# Patient Record
Sex: Male | Born: 1935 | Race: White | Hispanic: No | Marital: Married | Smoking: Former smoker
Health system: Southern US, Community
[De-identification: ages and names within clinical notes are randomized; demographics above are authoritative.]

## PROBLEM LIST (undated history)

## (undated) DIAGNOSIS — C4490 Unspecified malignant neoplasm of skin, unspecified: Secondary | ICD-10-CM

## (undated) DIAGNOSIS — M1990 Unspecified osteoarthritis, unspecified site: Secondary | ICD-10-CM

## (undated) DIAGNOSIS — L570 Actinic keratosis: Secondary | ICD-10-CM

## (undated) DIAGNOSIS — H409 Unspecified glaucoma: Secondary | ICD-10-CM

## (undated) DIAGNOSIS — I471 Supraventricular tachycardia, unspecified: Secondary | ICD-10-CM

## (undated) DIAGNOSIS — E349 Endocrine disorder, unspecified: Secondary | ICD-10-CM

## (undated) DIAGNOSIS — N529 Male erectile dysfunction, unspecified: Secondary | ICD-10-CM

## (undated) DIAGNOSIS — R55 Syncope and collapse: Secondary | ICD-10-CM

## (undated) DIAGNOSIS — I219 Acute myocardial infarction, unspecified: Secondary | ICD-10-CM

## (undated) DIAGNOSIS — I10 Essential (primary) hypertension: Secondary | ICD-10-CM

## (undated) DIAGNOSIS — I7300 Raynaud's syndrome without gangrene: Secondary | ICD-10-CM

## (undated) DIAGNOSIS — I2510 Atherosclerotic heart disease of native coronary artery without angina pectoris: Secondary | ICD-10-CM

## (undated) DIAGNOSIS — D689 Coagulation defect, unspecified: Secondary | ICD-10-CM

## (undated) DIAGNOSIS — E785 Hyperlipidemia, unspecified: Secondary | ICD-10-CM

## (undated) HISTORY — DX: Raynaud's syndrome without gangrene: I73.00

## (undated) HISTORY — DX: Actinic keratosis: L57.0

## (undated) HISTORY — DX: Essential (primary) hypertension: I10

## (undated) HISTORY — DX: Unspecified osteoarthritis, unspecified site: M19.90

## (undated) HISTORY — DX: Supraventricular tachycardia, unspecified: I47.10

## (undated) HISTORY — DX: Syncope and collapse: R55

## (undated) HISTORY — DX: Unspecified malignant neoplasm of skin, unspecified: C44.90

## (undated) HISTORY — PX: CARDIAC CATHETERIZATION: SHX172

## (undated) HISTORY — PX: TRACHEAL SURGERY: SHX1096

## (undated) HISTORY — DX: Atherosclerotic heart disease of native coronary artery without angina pectoris: I25.10

## (undated) HISTORY — DX: Unspecified glaucoma: H40.9

## (undated) HISTORY — PX: TONSILLECTOMY: SUR1361

## (undated) HISTORY — DX: Coagulation defect, unspecified: D68.9

## (undated) HISTORY — DX: Acute myocardial infarction, unspecified: I21.9

## (undated) HISTORY — DX: Endocrine disorder, unspecified: E34.9

## (undated) HISTORY — PX: CERVICAL SPINE SURGERY: SHX589

## (undated) HISTORY — DX: Male erectile dysfunction, unspecified: N52.9

## (undated) HISTORY — DX: Hyperlipidemia, unspecified: E78.5

## (undated) HISTORY — PX: OTHER SURGICAL HISTORY: SHX169

## (undated) HISTORY — DX: Supraventricular tachycardia: I47.1

---

## 2005-11-09 ENCOUNTER — Ambulatory Visit: Payer: Self-pay | Admitting: Unknown Physician Specialty

## 2011-05-11 ENCOUNTER — Ambulatory Visit: Payer: Self-pay | Admitting: Unknown Physician Specialty

## 2011-05-12 LAB — PATHOLOGY REPORT

## 2012-02-25 ENCOUNTER — Emergency Department: Payer: Self-pay | Admitting: Emergency Medicine

## 2012-02-25 LAB — BASIC METABOLIC PANEL
Anion Gap: 8 (ref 7–16)
Chloride: 104 mmol/L (ref 98–107)
Creatinine: 1.01 mg/dL (ref 0.60–1.30)
EGFR (African American): >60
Glucose: 92 mg/dL (ref 65–99)
Osmolality: 284 (ref 275–301)
Potassium: 4.5 mmol/L (ref 3.5–5.1)
Sodium: 142 mmol/L (ref 136–145)

## 2012-02-25 LAB — PROTIME-INR: Prothrombin Time: 11.8 secs (ref 11.5–14.7)

## 2012-02-25 LAB — CBC
MCH: 30 pg (ref 26.0–34.0)
MCHC: 32.3 g/dL (ref 32.0–36.0)
MCV: 93 fL (ref 80–100)
Platelet: 235 10*3/uL (ref 150–440)

## 2012-02-25 LAB — CK TOTAL AND CKMB (NOT AT ARMC)
CK, Total: 614 U/L — ABNORMAL HIGH (ref 35–232)
CK-MB: 17.5 ng/mL — ABNORMAL HIGH (ref 0.5–3.6)

## 2012-02-29 HISTORY — PX: CORONARY ANGIOPLASTY WITH STENT PLACEMENT: SHX49

## 2013-01-01 ENCOUNTER — Ambulatory Visit: Payer: Self-pay | Admitting: Family Medicine

## 2013-02-11 ENCOUNTER — Ambulatory Visit: Payer: Self-pay | Admitting: Unknown Physician Specialty

## 2013-02-20 ENCOUNTER — Ambulatory Visit: Payer: Self-pay | Admitting: Unknown Physician Specialty

## 2013-03-08 ENCOUNTER — Ambulatory Visit: Payer: Self-pay | Admitting: Neurology

## 2013-03-08 LAB — CREATININE, SERUM: EGFR (African American): >60

## 2015-09-07 ENCOUNTER — Ambulatory Visit (INDEPENDENT_AMBULATORY_CARE_PROVIDER_SITE_OTHER): Payer: Medicare Other | Admitting: Cardiovascular Disease

## 2015-09-07 ENCOUNTER — Encounter: Payer: Self-pay | Admitting: Cardiovascular Disease

## 2015-09-07 VITALS — BP 140/80 | HR 89 | Ht 72.0 in | Wt 174.0 lb

## 2015-09-07 DIAGNOSIS — Z955 Presence of coronary angioplasty implant and graft: Secondary | ICD-10-CM | POA: Diagnosis not present

## 2015-09-07 DIAGNOSIS — I25111 Atherosclerotic heart disease of native coronary artery with angina pectoris with documented spasm: Secondary | ICD-10-CM | POA: Insufficient documentation

## 2015-09-07 DIAGNOSIS — R61 Generalized hyperhidrosis: Secondary | ICD-10-CM

## 2015-09-07 DIAGNOSIS — E785 Hyperlipidemia, unspecified: Secondary | ICD-10-CM

## 2015-09-07 DIAGNOSIS — R531 Weakness: Secondary | ICD-10-CM

## 2015-09-07 DIAGNOSIS — I159 Secondary hypertension, unspecified: Secondary | ICD-10-CM | POA: Diagnosis not present

## 2015-09-07 NOTE — Patient Instructions (Signed)
You are doing well. No medication changes were made.  Please call if you get chest pain concerning for heart blockage  Please call us if you have new issues that need to be addressed before your next appt.  Your physician wants you to follow-up in: 12 months.  You will receive a reminder letter in the mail two months in advance. If you don't receive a letter, please call our office to schedule the follow-up appointment.

## 2015-09-07 NOTE — Assessment & Plan Note (Signed)
Episodes of weakness, sweating, lightheadedness, rare, possibly once every several months. Seems to improve with eating though unable to exclude drop in blood pressure or arrhythmia If symptoms get worse, would recommend a 30 day monitor. Previously wore Holter monitors but did not have an episode when he had the monitor in place. He will monitor his blood pressure at home when he has an episode to rule out hypotension or tachycardia

## 2015-09-07 NOTE — Assessment & Plan Note (Addendum)
Prior DES to the [place] in 2013 We'll continue aspirin, aggressive cholesterol management Prior cardiac catheterization report has been requested

## 2015-09-07 NOTE — Assessment & Plan Note (Signed)
Sweating episode as detailed abovewhen he has his weakness episodes with dizziness. Possibly from low sugar though workup as above

## 2015-09-07 NOTE — Assessment & Plan Note (Signed)
LDL slightly above goal. He prefers to leave it alone at this time. We did offer to increase Lipitor up to 40 mg daily. He will continue to work on his diet for now

## 2015-09-07 NOTE — Progress Notes (Signed)
Patient ID: Patrick Malone, male    DOB: October 05, 1931, 81 y.o.   MRN: [id number]  HPI Comments:  Patrick Malone  Is a very pleasant 81 year old gentleman with a history of coronary artery disease,  Cardiac catheterization May 2013 with Resolute  2.5 x 18 mm stent placed to his OM1 ( details unavailable on the rest of his disease , report has been requested),  Hyperlipidemia, prior  Cervical surgery,  Periodic episodes of weakness, sweating who presents to establish in the [place] office for the above.   In follow-up, he denies any chest pain concerning for angina.   No cardiac catheterization since 2013.  He has been taking Lipitor 20 mg daily  Most recent LDL in the 90s done through primary care   Reports his blood pressure is typically well controlled  No regular exercise program, limited by arthralgias   His biggest complaint is periodic episodes of weakness, sweating that can presents at any time though rare.  Episodes might be once every several months  He does report significant workup in the past including numerous Holter monitors demonstrating tachycardia episodes,  Prior stress testing , all of which were unrevealing per the patient.  He has never had a monitor in place when he had an episode.  He does report prior episodes were relieved with food and seemed to present more when he missed a meal   EKG on today's visit shows normal sinus rhythm with rate 89 bpm, No significant ST or T-wave changes  Allergies  Allergen Reactions  . Cefazolin Diarrhea and Rash  . Clindamycin Diarrhea and Rash    No current outpatient prescriptions on file prior to visit.   No current facility-administered medications on file prior to visit.    Past Medical History  Diagnosis Date  . Coronary artery disease   . Hypertension   . Hyperlipidemia   . MI (myocardial infarction) ([place])   . Osteoarthritis   . Syncope and collapse   . ED (erectile dysfunction)   . Hypotestosteronism   . Glaucoma    . Clotting disorder ([place])   . SVT (supraventricular tachycardia) ([place])   . Raynaud's disease     Past Surgical History  Procedure Laterality Date  . Cardiac catheterization    . Coronary angioplasty with stent placement  02/29/2012    [facility] placed the stent 2.5 mm x 18 mm Ref # [id number] # [id number]  . Tonsillectomy    . Lung cyst removed     . Cervical spine surgery    . Tracheal surgery      Social History  reports that he has quit smoking. His smoking use included Cigarettes. He has a 10 pack-year smoking history. He does not have any smokeless tobacco history on file. He reports that he does not drink alcohol or use illicit drugs.  Family History family history includes Heart attack (age of onset: 49) in his mother.    Review of Systems  Respiratory: Negative.   Cardiovascular: Negative.   Gastrointestinal: Negative.   Musculoskeletal: Negative.   Neurological: Positive for dizziness and weakness.       Sweating episodes  Hematological: Negative.   Psychiatric/Behavioral: Negative.     BP 140/80 mmHg  Pulse 89  Ht 6' (1.829 m)  Wt 174 lb (78.926 kg)  BMI 23.59 kg/m2  Physical Exam  Constitutional: He is oriented to person, place, and time. He appears well-developed and well-nourished.  HENT:  Head: Normocephalic.  Nose: Nose normal.  Mouth/Throat:  Oropharynx is clear and moist.  Eyes: Conjunctivae are normal. Pupils are equal, round, and reactive to light.  Neck: Normal range of motion. Neck supple. No JVD present.  Cardiovascular: Normal rate, regular rhythm, normal heart sounds and intact distal pulses.  Exam reveals no gallop and no friction rub.   No murmur heard. Pulmonary/Chest: Effort normal and breath sounds normal. No respiratory distress. He has no wheezes. He has no rales. He exhibits no tenderness.  Abdominal: Soft. Bowel sounds are normal. He exhibits no distension. There is no tenderness.  Musculoskeletal: Normal range of motion. He  exhibits no edema or tenderness.  Well-healed incision site in his left anterior neck from prior surgery. Some decreased range of motion of the neck  Lymphadenopathy:    He has no cervical adenopathy.  Neurological: He is alert and oriented to person, place, and time. Coordination normal.  Skin: Skin is warm and dry. No rash noted. No erythema.  Psychiatric: He has a normal mood and affect. His behavior is normal. Judgment and thought content normal.

## 2015-09-07 NOTE — Assessment & Plan Note (Signed)
Blood pressure is well controlled on today's visit. No changes made to the medications. 

## 2015-09-07 NOTE — Assessment & Plan Note (Signed)
Currently with no symptoms of angina. No further workup at this time. Continue current medication regimen. 

## 2016-12-05 ENCOUNTER — Encounter: Payer: Self-pay | Admitting: Cardiovascular Disease

## 2016-12-05 ENCOUNTER — Ambulatory Visit (INDEPENDENT_AMBULATORY_CARE_PROVIDER_SITE_OTHER): Payer: Medicare Other | Admitting: Cardiovascular Disease

## 2016-12-05 VITALS — BP 140/80 | HR 66 | Ht 72.0 in | Wt 174.5 lb

## 2016-12-05 DIAGNOSIS — R61 Generalized hyperhidrosis: Secondary | ICD-10-CM | POA: Diagnosis not present

## 2016-12-05 DIAGNOSIS — Z955 Presence of coronary angioplasty implant and graft: Secondary | ICD-10-CM

## 2016-12-05 DIAGNOSIS — I25111 Atherosclerotic heart disease of native coronary artery with angina pectoris with documented spasm: Secondary | ICD-10-CM | POA: Diagnosis not present

## 2016-12-05 DIAGNOSIS — I10 Essential (primary) hypertension: Secondary | ICD-10-CM | POA: Diagnosis not present

## 2016-12-05 DIAGNOSIS — E785 Hyperlipidemia, unspecified: Secondary | ICD-10-CM

## 2016-12-05 MED ORDER — ATORVASTATIN CALCIUM 40 MG PO TABS: 40.0000 mg | ORAL_TABLET | Freq: Every day | ORAL | 3 refills | Status: DC

## 2016-12-05 NOTE — Progress Notes (Signed)
Cardiology Office Note  Date:  12/05/2016   ID:  Patrick Malone, DOB 1932-07-05, MRN [id number]  PCP:  Juluis Pitch, MD   Chief Complaint  Patient presents with  . other    12 month follow up. Meds reviewed by the pt. verbally. "doing well." Pt. c/o chest twinges at times.     HPI:  Patrick Malone  Is a very pleasant 83 year old gentleman with a history of coronary artery disease,  Cardiac catheterization May 2013 with Resolute  2.5 x 18 mm stent placed to his OM1 (99% to 0%),  Hyperlipidemia, prior  Cervical surgery,  Periodic episodes of weakness, sweating Who presents for routine follow-up of his coronary artery disease  No recent sweating episodes, Thinks it is low sugar. Takes this neck when he has these symptoms and symptoms resolve  Rare chest  Twinges, arm pain, not with exertion  Left rotator cuff problem Limited range of motion of his left arm Difficult time measuring BP at home  denies any chest pain concerning for angina.  He has been taking Lipitor 20 mg daily  LDL in the 90s done through primary care Total chol 154, LDL 93   No regular exercise program, limited by arthralgias   EKG on today's visit shows normal sinus rhythm with rate 66 bpm, No significant ST or T-wave changes  Other past medical history reviewed Previous periodic episodes of weakness, sweating that can presents at any time though rare.  Episodes might be once every several months  He does report significant workup in the past including numerous Holter monitors demonstrating tachycardia episodes,  Prior stress testing , all of which were unrevealing per the patient.  He has never had a monitor in place when he had an episode.  He does report prior episodes were relieved with food and seemed to present more when he missed a meal   PMH:   has a past medical history of Clotting disorder ([place]); Coronary artery disease; ED (erectile dysfunction); Glaucoma; Hyperlipidemia; Hypertension;  Hypotestosteronism; MI (myocardial infarction); Osteoarthritis; Raynaud's disease; SVT (supraventricular tachycardia) ([place]); and Syncope and collapse.  PSH:    Past Surgical History:  Procedure Laterality Date  . CARDIAC CATHETERIZATION    . CERVICAL SPINE SURGERY    . CORONARY ANGIOPLASTY WITH STENT PLACEMENT  02/29/2012   [facility] placed the stent 2.5 mm x 18 mm Ref # [id number] LOT # [id number]  . lung cyst removed     . TONSILLECTOMY    . TRACHEAL SURGERY      Current Outpatient Prescriptions  Medication Sig Dispense Refill  . aspirin 81 MG chewable tablet Chew 81 mg by mouth daily.     Marland Kitchen atorvastatin (LIPITOR) 40 MG tablet Take 1 tablet (40 mg total) by mouth daily. 90 tablet 3  . lisinopril (PRINIVIL,ZESTRIL) 10 MG tablet TAKE ONE TABLET BY MOUTH ONCE DAILY    . timolol (TIMOPTIC) 0.5 % ophthalmic solution Place 1 drop into both eyes daily.      No current facility-administered medications for this visit.      Allergies:   Cefazolin and Clindamycin   Social History:  The patient  reports that he has quit smoking. His smoking use included Cigarettes. He has a 10.00 pack-year smoking history. He has never used smokeless tobacco. He reports that he does not drink alcohol or use drugs.   Family History:   family history includes Heart attack (age of onset: 33) in his mother.    Review of Systems: Review of Systems  Constitutional: Negative.        Rare episodes of sweating  Respiratory: Negative.   Cardiovascular: Negative.   Gastrointestinal: Negative.   Musculoskeletal: Positive for joint pain.       Decreased range of motion left arm  Neurological: Negative.   Psychiatric/Behavioral: Negative.   All other systems reviewed and are negative.    PHYSICAL EXAM: VS:  BP 140/80 (BP Location: Right Arm, Patient Position: Sitting, Cuff Size: Normal)   Pulse 66   Ht 6' (1.829 m)   Wt 174 lb 8 oz (79.2 kg)   BMI 23.67 kg/m  , BMI Body mass index is 23.67 kg/m. GEN: Well  nourished, well developed, in no acute distress  HEENT: normal  Neck: no JVD, carotid bruits, or masses Cardiac: RRR; no murmurs, rubs, or gallops,no edema  Respiratory:  clear to auscultation bilaterally, normal work of breathing GI: soft, nontender, nondistended, + BS MS: no deformity or atrophy , decreased range of motion left arm unable to supinate well Skin: warm and dry, no rash Neuro:  Strength and sensation are intact Psych: euthymic mood, full affect    Recent Labs: No results found for requested labs within last 8760 hours.    Lipid Panel No results found for: CHOL, HDL, LDLCALC, TRIG    Wt Readings from Last 3 Encounters:  12/05/16 174 lb 8 oz (79.2 kg)  09/07/15 174 lb (78.9 kg)       ASSESSMENT AND PLAN:  Coronary artery disease involving native coronary artery of native heart with angina pectoris with documented spasm ([place]) - Plan: EKG 12-Lead Currently with no symptoms of angina. No further workup at this time. Continue current medication regimen.  Hyperlipidemia, unspecified hyperlipidemia type - Plan: EKG 12-Lead Long discussion with him concerning his cholesterol. LDL is elevated Recommended he increase Lipitor up to 40 mg daily to achieve LDL less than 70 New prescription sent in  S/P coronary artery stent placement - Plan: EKG 12-Lead Discussed previous stent with him placed May 2013 OM vessel Catheterization report reviewed showing no other significant disease at the time  Essential hypertension Initial blood pressure elevated, on recheck blood pressure improved down to [id number] systolic Unable to monitor blood pressure at home as he has difficulty moving his left arm Wife unable to check blood pressure for him as she has vision issues  Excessive sweating Prior workup for episodes of sweating. Feels it is from low sugar No further workup at this time   Total encounter time more than 25 minutes  Greater than 50% was spent in counseling and  coordination of care with the patient   Disposition:   F/U  12 months   Orders Placed This Encounter  Procedures  . EKG 12-Lead     Signed, Esmond Plants, M.D., Ph.D. 12/05/2016  [place], [place]

## 2016-12-05 NOTE — Patient Instructions (Signed)
Medication Instructions:   Please increase the atorvastatin up to 40 mg daily  Labwork:  No new labs needed  Testing/Procedures:  No further testing at this time   I recommend watching educational videos on topics of interest to you at:       [email or web address]  Enter code: [email or web address]    Follow-Up: It was a pleasure seeing you in the office today. Please call us if you have new issues that need to be addressed before your next appt.  [phone]  Your physician wants you to follow-up in: 12 months.  You will receive a reminder letter in the mail two months in advance. If you don't receive a letter, please call our office to schedule the follow-up appointment.  If you need a refill on your cardiac medications before your next appointment, please call your pharmacy.

## 2017-11-15 ENCOUNTER — Other Ambulatory Visit: Payer: Self-pay | Admitting: Cardiovascular Disease

## 2017-11-15 MED ORDER — ATORVASTATIN CALCIUM 40 MG PO TABS: 40.0000 mg | ORAL_TABLET | Freq: Every day | ORAL | 3 refills | Status: DC

## 2018-02-15 ENCOUNTER — Other Ambulatory Visit: Payer: Self-pay | Admitting: Cardiovascular Disease

## 2018-02-15 ENCOUNTER — Telehealth: Payer: Self-pay | Admitting: Cardiovascular Disease

## 2018-02-15 NOTE — Telephone Encounter (Signed)
Pt has not been seen in over a year needs to schedule future appointment.

## 2018-02-15 NOTE — Telephone Encounter (Signed)
Spoke with patient's wife and patient just went to pharmacy She states she will have patient call to schedule appointment as soon as he comes home  Will await call back

## 2018-02-15 NOTE — Telephone Encounter (Signed)
° °*  STAT* If patient is at the pharmacy, call can be transferred to refill team.   1. Which medications need to be refilled? (please list name of each medication and dose if known) Lipitor 40 mg   2. Which pharmacy/location (including street and city if local pharmacy) is medication to be sent to?[facility]   3. Do they need a 30 day or 90 day supply? 90 day

## 2018-02-15 NOTE — Telephone Encounter (Signed)
Scheduled 6/12 with Dr. Rockey Situ

## 2018-03-13 NOTE — Progress Notes (Signed)
Cardiology Office Note  Date:  03/14/2018   ID:  Patrick Malone, DOB 03-Jul-1932, MRN [id number]  PCP:  Patrick Pitch, MD   Chief Complaint  Patient presents with  . OTHER    12 month f/u no complaints today. Meds reviewed verbally with pt.    HPI:  Patrick Malone  is a very Patrick 84 year old gentleman with a history of  coronary artery disease,   Cardiac catheterization May 2013 with Resolute  2.5 x 18 mm stent placed to his OM1 (99% to 0%),   Hyperlipidemia,  prior  Cervical surgery,   Periodic episodes of weakness, sweating  Who presents for routine follow-up of his coronary artery disease  Still with rare sweating episodes, Low sugars Left shoulder pain, limited range of motion Difficult time measuring BP at home Drinks ensure Everything hangs in the throat, since the cervical surgery (scar tissue) Seen by ENT  denies any chest pain concerning for angina.   He has been taking Lipitor 40 mg daily   LDL in the 90s done through primary care Total chol 127, LDL 72   No regular exercise program, limited by arthralgias  EKG personally reviewed by myself on todays visit Shows  normal sinus rhythm with rate 68 bpm, No significant ST or T-wave changes  Other past medical history reviewed Previous periodic episodes of weakness, sweating that can presents at any time though rare.  Episodes might be once every several months  He does report significant workup in the past including numerous Holter monitors demonstrating tachycardia episodes,  Prior stress testing , all of which were unrevealing per the patient.  He has never had a monitor in place when he had an episode.  He does report prior episodes were relieved with food and seemed to present more when he missed a meal   PMH:   has a past medical history of Clotting disorder (Patrick Malone), Coronary artery disease, ED (erectile dysfunction), Glaucoma, Hyperlipidemia, Hypertension, Hypotestosteronism, MI (myocardial infarction)  (Patrick Malone), Osteoarthritis, Raynaud's disease, SVT (supraventricular tachycardia) (Patrick Malone), and Syncope and collapse.  PSH:    Past Surgical History:  Procedure Laterality Date  . CARDIAC CATHETERIZATION    . CERVICAL SPINE SURGERY    . CORONARY ANGIOPLASTY WITH STENT PLACEMENT  02/29/2012   [facility] placed the stent 2.5 mm x 18 mm Ref # [id number] LOT # [id number]  . lung cyst removed     . TONSILLECTOMY    . TRACHEAL SURGERY      Current Outpatient Medications  Medication Sig Dispense Refill  . aspirin 81 MG chewable tablet Chew 81 mg by mouth daily.     Marland Kitchen atorvastatin (LIPITOR) 40 MG tablet Take 1 tablet (40 mg total) by mouth daily. 30 tablet 0  . lisinopril (PRINIVIL,ZESTRIL) 10 MG tablet TAKE ONE TABLET BY MOUTH ONCE DAILY    . timolol (TIMOPTIC) 0.5 % ophthalmic solution Place 1 drop into both eyes daily.      No current facility-administered medications for this visit.      Allergies:   Cefazolin and Clindamycin   Social History:  The patient  reports that he has quit smoking. His smoking use included cigarettes. He has a 10.00 pack-year smoking history. He has never used smokeless tobacco. He reports that he does not drink alcohol or use drugs.   Family History:   family history includes Heart attack (age of onset: 62) in his mother.    Review of Systems: Review of Systems  Constitutional: Negative.  Rare episodes of sweating  Respiratory: Negative.   Cardiovascular: Negative.   Gastrointestinal: Negative.        Dysphagia  Musculoskeletal:       Decreased range of motion left arm  Neurological: Negative.   Psychiatric/Behavioral: Negative.   All other systems reviewed and are negative.    PHYSICAL EXAM: VS:  BP 126/78 (BP Location: Left Arm, Patient Position: Sitting, Cuff Size: Normal)   Pulse 74   Ht 6' (1.829 m)   Wt 167 lb 12 oz (76.1 kg)   BMI 22.75 kg/m  , BMI Body mass index is 22.75 kg/m. Constitutional:  oriented to person, place, and time. No  distress.  HENT:  Head: Normocephalic and atraumatic.  Eyes:  no discharge. No scleral icterus.  Neck: Normal range of motion. Neck supple. No JVD present.  Old surgical scars anterior neck Cardiovascular: Normal rate, regular rhythm, normal heart sounds and intact distal pulses. Exam reveals no gallop and no friction rub. No edema No murmur heard. Pulmonary/Chest: Effort normal and breath sounds normal. No stridor. No respiratory distress.  no wheezes.  no rales.  no tenderness.  Abdominal: Soft.  no distension.  no tenderness.  Musculoskeletal: Normal range of motion.  no  tenderness or deformity.  Neurological:  normal muscle tone. Coordination normal. No atrophy Skin: Skin is warm and dry. No rash noted. not diaphoretic.  Psychiatric:  normal mood and affect. behavior is normal. Thought content normal.    Recent Labs: No results found for requested labs within last 8760 hours.    Lipid Panel No results found for: CHOL, HDL, LDLCALC, TRIG    Wt Readings from Last 3 Encounters:  03/14/18 167 lb 12 oz (76.1 kg)  12/05/16 174 lb 8 oz (79.2 kg)  09/07/15 174 lb (78.9 kg)      ASSESSMENT AND PLAN:  Coronary artery disease involving native coronary artery of native heart with angina pectoris with documented spasm (Patrick Malone) - Plan: EKG 12-Lead Currently with no symptoms of angina. No further workup at this time. Continue current medication regimen.  Hyperlipidemia, unspecified hyperlipidemia type - Plan: EKG 12-Lead Cholesterol is at goal on the current lipid regimen. No changes to the medications were made.  S/P coronary artery stent placement - Plan: EKG 12-Lead  stent with him placed May 2013 OM vessel  no other significant disease at the time Continue aspirin and statin  Essential hypertension Blood pressure is well controlled on today's visit. No changes made to the medications.  Excessive sweating Prior workup for episodes of sweating. Feels it is from low  sugar Continues to have rare episodes improved with a snack   Total encounter time more than 25 minutes  Greater than 50% was spent in counseling and coordination of care with the patient   Disposition:   F/U  12 months   Orders Placed This Encounter  Procedures  . EKG 12-Lead     Signed, Esmond Plants, M.D., Ph.D. 03/14/2018  [place], [place]

## 2018-03-14 ENCOUNTER — Encounter: Payer: Self-pay | Admitting: Cardiovascular Disease

## 2018-03-14 ENCOUNTER — Ambulatory Visit: Payer: Medicare Other | Admitting: Cardiovascular Disease

## 2018-03-14 VITALS — BP 126/78 | HR 74 | Ht 72.0 in | Wt 167.8 lb

## 2018-03-14 DIAGNOSIS — E785 Hyperlipidemia, unspecified: Secondary | ICD-10-CM

## 2018-03-14 DIAGNOSIS — R531 Weakness: Secondary | ICD-10-CM

## 2018-03-14 DIAGNOSIS — Z955 Presence of coronary angioplasty implant and graft: Secondary | ICD-10-CM

## 2018-03-14 DIAGNOSIS — I25111 Atherosclerotic heart disease of native coronary artery with angina pectoris with documented spasm: Secondary | ICD-10-CM

## 2018-03-14 DIAGNOSIS — I159 Secondary hypertension, unspecified: Secondary | ICD-10-CM

## 2018-03-14 MED ORDER — LISINOPRIL 10 MG PO TABS: 10.0000 mg | ORAL_TABLET | Freq: Every day | ORAL | 4 refills | Status: DC

## 2018-03-14 MED ORDER — ATORVASTATIN CALCIUM 40 MG PO TABS: 40.0000 mg | ORAL_TABLET | Freq: Every day | ORAL | 4 refills | Status: DC

## 2018-03-14 NOTE — Patient Instructions (Signed)

## 2018-07-27 ENCOUNTER — Encounter: Payer: Self-pay | Admitting: Emergency Medicine

## 2018-07-27 ENCOUNTER — Emergency Department: Payer: Medicare Other

## 2018-07-27 ENCOUNTER — Emergency Department
Admission: EM | Admit: 2018-07-27 | Discharge: 2018-07-27 | Disposition: A | Discharge status: Home / Self Care | Payer: Medicare Other | Attending: Emergency Medicine | Admitting: Emergency Medicine

## 2018-07-27 ENCOUNTER — Other Ambulatory Visit: Payer: Self-pay

## 2018-07-27 DIAGNOSIS — I10 Essential (primary) hypertension: Secondary | ICD-10-CM | POA: Insufficient documentation

## 2018-07-27 DIAGNOSIS — R1030 Lower abdominal pain, unspecified: Secondary | ICD-10-CM | POA: Diagnosis present

## 2018-07-27 DIAGNOSIS — Z79899 Other long term (current) drug therapy: Secondary | ICD-10-CM | POA: Insufficient documentation

## 2018-07-27 DIAGNOSIS — I2510 Atherosclerotic heart disease of native coronary artery without angina pectoris: Secondary | ICD-10-CM | POA: Insufficient documentation

## 2018-07-27 DIAGNOSIS — Z955 Presence of coronary angioplasty implant and graft: Secondary | ICD-10-CM | POA: Diagnosis not present

## 2018-07-27 DIAGNOSIS — Z7982 Long term (current) use of aspirin: Secondary | ICD-10-CM | POA: Diagnosis not present

## 2018-07-27 DIAGNOSIS — Z87891 Personal history of nicotine dependence: Secondary | ICD-10-CM | POA: Diagnosis not present

## 2018-07-27 LAB — COMPREHENSIVE METABOLIC PANEL
ALK PHOS: 88 U/L (ref 38–126)
ALT: 20 U/L (ref 0–44)
ANION GAP: 10 (ref 5–15)
AST: 20 U/L (ref 15–41)
Albumin: 4.1 g/dL (ref 3.5–5.0)
BUN: 21 mg/dL (ref 8–23)
CALCIUM: 9.1 mg/dL (ref 8.9–10.3)
CO2: 29 mmol/L (ref 22–32)
Chloride: 101 mmol/L (ref 98–111)
Creatinine, Ser: 1.08 mg/dL (ref 0.61–1.24)
GFR calc non Af Amer: >60 mL/min (ref >60)
Glucose, Bld: 98 mg/dL (ref 70–99)
Potassium: 4.3 mmol/L (ref 3.5–5.1)
SODIUM: 140 mmol/L (ref 135–145)
TOTAL PROTEIN: 7.5 g/dL (ref 6.5–8.1)
Total Bilirubin: 1.4 mg/dL — ABNORMAL HIGH (ref 0.3–1.2)

## 2018-07-27 LAB — CBC WITH DIFFERENTIAL/PLATELET
Abs Immature Granulocytes: 0.08 10*3/uL — ABNORMAL HIGH (ref 0.00–0.07)
BASOS PCT: 0 %
Basophils Absolute: 0 10*3/uL (ref 0.0–0.1)
EOS ABS: 0.1 10*3/uL (ref 0.0–0.5)
Eosinophils Relative: 1 %
HCT: 44.9 % (ref 39.0–52.0)
HEMOGLOBIN: 14.4 g/dL (ref 13.0–17.0)
Immature Granulocytes: 1 %
LYMPHS PCT: 14 %
Lymphs Abs: 1.6 10*3/uL (ref 0.7–4.0)
MCH: 28.9 pg (ref 26.0–34.0)
MCHC: 32.1 g/dL (ref 30.0–36.0)
MCV: 90.2 fL (ref 80.0–100.0)
MONO ABS: 1 10*3/uL (ref 0.1–1.0)
Monocytes Relative: 9 %
Neutro Abs: 8.8 10*3/uL — ABNORMAL HIGH (ref 1.7–7.7)
Neutrophils Relative %: 75 %
Platelets: 252 10*3/uL (ref 150–400)
RBC: 4.98 MIL/uL (ref 4.22–5.81)
RDW: 14.2 % (ref 11.5–15.5)
WBC: 11.6 10*3/uL — AB (ref 4.0–10.5)
nRBC: 0 % (ref 0.0–0.2)

## 2018-07-27 LAB — URINALYSIS, COMPLETE (UACMP) WITH MICROSCOPIC
BACTERIA UA: NONE SEEN
BILIRUBIN URINE: NEGATIVE
Glucose, UA: NEGATIVE mg/dL
Hgb urine dipstick: NEGATIVE
KETONES UR: 5 mg/dL — AB
LEUKOCYTES UA: NEGATIVE
NITRITE: NEGATIVE
PROTEIN: NEGATIVE mg/dL
Specific Gravity, Urine: 1.02 (ref 1.005–1.030)
pH: 5 (ref 5.0–8.0)

## 2018-07-27 LAB — TROPONIN I: Troponin I: <0.03 ng/mL (ref <0.03)

## 2018-07-27 MED ORDER — IOPAMIDOL (ISOVUE-300) INJECTION 61%
100.0000 mL | Freq: Once | INTRAVENOUS | Status: AC | PRN
  Administered 2018-07-27: 100 mL via INTRAVENOUS

## 2018-07-27 NOTE — Discharge Instructions (Addendum)
We suspect that your abdominal pain is due to a muscle strain or other benign cause.  Your CAT scan shows no other concerning abnormalities.  You can take ibuprofen (Advil) 400 mg (2 pills) every 6 hours with food, and the tramadol that you already have as needed for any more severe pain.  Follow-up with your regular doctor.  Return to the ER for new, worsening, persistent pain, vomiting, fevers, urinary symptoms, weakness, or any other new or worsening symptoms that concern you.

## 2018-07-27 NOTE — ED Provider Notes (Signed)
[facility] Emergency Department Provider Note ____________________________________________   First MD Initiated Contact with Patient 07/27/18 0730     (approximate)  I have reviewed the triage vital signs and the nursing notes.   HISTORY  Chief Complaint Back Pain and Abdominal Pain    HPI Patrick Malone is a 84 y.o. male with PMH as noted below who presents primarily with lower abdominal/suprapubic pain over the last 1 to 2 days, gradual onset, constant, and nonradiating.  The patient states that he had a mechanical fall from standing height 2 days ago and initially had some lower back pain, but now the back feels better and his belly is what is bothering him.  He denies any nausea or vomiting.  He states that he had one small bowel movement yesterday.  He denies any urinary symptoms or fever.  He does report having occasionally had mild twinges of pain in that area previously.   Past Medical History:  Diagnosis Date  . Clotting disorder ([place])   . Coronary artery disease   . ED (erectile dysfunction)   . Glaucoma   . Hyperlipidemia   . Hypertension   . Hypotestosteronism   . MI (myocardial infarction) ([place])   . Osteoarthritis   . Raynaud's disease   . SVT (supraventricular tachycardia) ([place])   . Syncope and collapse     Patient Active Problem List   Diagnosis Date Noted  . S/P coronary artery stent placement 09/07/2015  . Coronary artery disease involving native coronary artery of native heart with angina pectoris with documented spasm ([place]) 09/07/2015  . Excessive sweating 09/07/2015  . Weakness 09/07/2015  . Secondary hypertension, unspecified 09/07/2015  . Hyperlipidemia 09/07/2015    Past Surgical History:  Procedure Laterality Date  . CARDIAC CATHETERIZATION    . CERVICAL SPINE SURGERY    . CORONARY ANGIOPLASTY WITH STENT PLACEMENT  02/29/2012   [facility] placed the stent 2.5 mm x 18 mm Ref # [id number] LOT # [id number]  . lung cyst  removed     . TONSILLECTOMY    . TRACHEAL SURGERY      Prior to Admission medications   Medication Sig Start Date End Date Taking? Authorizing Provider  aspirin 81 MG chewable tablet Chew 81 mg by mouth daily.    Yes [provider]  atorvastatin (LIPITOR) 40 MG tablet Take 1 tablet (40 mg total) by mouth daily. 03/14/18  Yes Gollan, Kathlene November, MD  lisinopril (PRINIVIL,ZESTRIL) 10 MG tablet Take 1 tablet (10 mg total) by mouth daily. 03/14/18  Yes Gollan, Kathlene November, MD  timolol (TIMOPTIC) 0.5 % ophthalmic solution Place 1 drop into both eyes daily.  02/05/14  Yes [provider]  traMADol (ULTRAM) 50 MG tablet Take 50 mg by mouth daily as needed for moderate pain.   Yes [provider]    Allergies Cefazolin and Clindamycin  Family History  Problem Relation Age of Onset  . Heart attack Mother 41    Social History Social History   Tobacco Use  . Smoking status: Former Smoker    Packs/day: 1.00    Years: 10.00    Pack years: 10.00    Types: Cigarettes  . Smokeless tobacco: Never Used  Substance Use Topics  . Alcohol use: No  . Drug use: No    Review of Systems  Constitutional: No fever. Eyes: No redness. ENT: No neck pain. Cardiovascular: Denies chest pain. Respiratory: Denies shortness of breath. Gastrointestinal: No vomiting or diarrhea.  Genitourinary: Negative  for dysuria.  Musculoskeletal: Positive for back pain. Skin: Negative for rash. Neurological: Negative for headaches, focal weakness or numbness.   ____________________________________________   PHYSICAL EXAM:  VITAL SIGNS: ED Triage Vitals  Enc Vitals Group     BP 07/27/18 0649 131/86     Pulse Rate 07/27/18 0649 (!) 101     Resp 07/27/18 0649 18     Temp 07/27/18 0649 (!) 97.4 F (36.3 C)     Temp Source 07/27/18 0649 Oral     SpO2 07/27/18 0649 96 %     Weight 07/27/18 0648 170 lb (77.1 kg)     Height 07/27/18 0648 6' (1.829 m)     Head Circumference --      Peak  Flow --      Pain Score 07/27/18 0650 9     Pain Loc --      Pain Edu? --      Excl. in [place]? --     Constitutional: Alert and oriented. Well appearing for age and in no acute distress. Eyes: Conjunctivae are normal.  Head: Atraumatic. Nose: No congestion/rhinnorhea. Mouth/Throat: Mucous membranes are moist.   Neck: Normal range of motion.  Cardiovascular: Good peripheral circulation. Respiratory: Normal respiratory effort.  Gastrointestinal: Soft with mild suprapubic and right inguinal area tenderness with no palpable hernia or mass.  No distention.  Genitourinary: No flank tenderness. Musculoskeletal: No lower extremity edema.  Extremities warm and well perfused.  Full range of motion of bilateral hips.  No midline lumbar or sacral spinal tenderness. Neurologic:  Normal speech and language.  Motor and sensory intact in all extremities.  No gross focal neurologic deficits are appreciated.  Skin:  Skin is warm and dry. No rash noted. Psychiatric: Mood and affect are normal. Speech and behavior are normal.  ____________________________________________   LABS (all labs ordered are listed, but only abnormal results are displayed)  Labs Reviewed  CBC WITH DIFFERENTIAL/PLATELET - Abnormal; Notable for the following components:      Result Value   WBC 11.6 (*)    Neutro Abs 8.8 (*)    Abs Immature Granulocytes 0.08 (*)    All other components within normal limits  COMPREHENSIVE METABOLIC PANEL - Abnormal; Notable for the following components:   Total Bilirubin 1.4 (*)    All other components within normal limits  URINALYSIS, COMPLETE (UACMP) WITH MICROSCOPIC - Abnormal; Notable for the following components:   Color, Urine YELLOW (*)    APPearance HAZY (*)    Ketones, ur 5 (*)    All other components within normal limits  TROPONIN I   ____________________________________________  EKG   ____________________________________________  RADIOLOGY  XR lumbar spine:  Age-indeterminate compression T11, T12, L1 XR sacral spine: No acute fracture CT abdomen: No acute abnormalities ____________________________________________   PROCEDURES  Procedure(s) performed: No  Procedures  Critical Care performed: No ____________________________________________   INITIAL IMPRESSION / ASSESSMENT AND PLAN / ED COURSE  Pertinent labs & imaging results that were available during my care of the patient were reviewed by me and considered in my medical decision making (see chart for details).  84 year old male with PMH as noted above presents with lower abdominal/suprapubic area pain after a fall 2 days ago although the pain did not start directly after the fall.  He also had some lower back pain but this seems to be resolving.  On exam, the patient is well-appearing and his vital signs are normal except for hypertension.  He has no tenderness in the lumbar  or sacral spine.  He has full range of motion and no tenderness of bilateral hips and knees.  He does have some tenderness to the suprapubic and right inguinal area however there is no palpable mass or an obviously reducible hernia.  X-rays were obtained from triage which reveal T11, T12, L1 compression fractures which are age-indeterminate.  The patient has no active pain in that area and no tenderness there, so these are consistent with chronic fractures and do not require acute intervention.  In terms of the abdominal pain, it is possible although unlikely that it is traumatic.  It seems to have started sometime after the fall and is more gradual in onset.  Differential includes UTI/cystitis, hernia, colitis or proctitis, or other intra-abdominal cause.  We will obtain labs and a CT abdomen and reassess.  ----------------------------------------- 10:42 AM on 07/27/2018 -----------------------------------------  The patient continues to be relatively comfortable without any pain medication in the ED.  The lab  work-up, UA, and CT show no significant acute findings.  There is no evidence of hernia, UTI or other acute cause of the pain.  Given these findings I suspect a benign cause such as inguinal strain.  I counseled the patient on the results of the work-up (including the likely chronic lumbar compression fractures).  He feels comfortable going home.   I gave him thorough return precautions and he expressed understanding.  ____________________________________________   FINAL CLINICAL IMPRESSION(S) / ED DIAGNOSES  Final diagnoses:  Lower abdominal pain      NEW MEDICATIONS STARTED DURING THIS VISIT:  New Prescriptions   No medications on file     Note:  This document was prepared using Dragon voice recognition software and may include unintentional dictation errors.    Arta Silence, MD 07/27/18 1043

## 2018-07-27 NOTE — ED Triage Notes (Signed)
Patient to ER for c/o fall day before yesterday (states his feet got tangled up and he fell back "on my bottom" on concrete. Patient states he has had increasingly more lower back pain since the fall. Patient also concerned because he is having pain to "abdomen or bowels, I'm not sure".

## 2019-03-18 ENCOUNTER — Other Ambulatory Visit: Payer: Self-pay | Admitting: Cardiovascular Disease

## 2019-04-09 ENCOUNTER — Other Ambulatory Visit: Payer: Self-pay | Admitting: Cardiovascular Disease

## 2019-04-09 NOTE — Telephone Encounter (Signed)
Left message with wife for patient call back to schedule F/U appointment.

## 2019-04-17 ENCOUNTER — Other Ambulatory Visit: Payer: Self-pay | Admitting: Cardiovascular Disease

## 2019-05-13 ENCOUNTER — Other Ambulatory Visit: Payer: Self-pay | Admitting: Cardiovascular Disease

## 2019-05-15 ENCOUNTER — Other Ambulatory Visit: Payer: Self-pay | Admitting: Cardiovascular Disease

## 2019-05-27 NOTE — Progress Notes (Signed)
Cardiology Office Note  Date:  05/28/2019   ID:  Patrick Malone, DOB 1932/08/20, MRN [id number]  PCP:  Juluis Pitch, MD   Chief Complaint  Patient presents with  . Other    12 month follow up. Patient c/0 of chest tightness at times. Meds reviewed verbally with patient.     HPI:  [name]  is a very pleasant 85 year old gentleman with a history of  coronary artery disease,   Cardiac catheterization May 2013 with Resolute  2.5 x 18 mm stent placed to his OM1 (99% to 0%),   Hyperlipidemia,  prior  Cervical surgery,   Periodic episodes of weakness, sweating  Who presents for routine follow-up of his coronary artery disease  Tired more, poor energy Legs very weak, difficulty getting on exam table [place] recently, lost balance Needed help to get up  Still with rare sweating episodes, maybe sugars low  Left shoulder pain, limited range of motion  Everything hangs in the throat, since the cervical surgery (scar tissue) Seen by ENT Coughs up food hours after eating  denies any chest pain concerning for angina.  taking Lipitor 40 mg daily No recent lipid panel Total chol 127, LDL 72   No regular exercise program, limited by arthralgias  EKG personally reviewed by myself on todays visit Shows  normal sinus rhythm with rate 92 bpm, No significant ST or T-wave changes  Other past medical history reviewed Previous periodic episodes of weakness, sweating that can presents at any time though rare.  Episodes might be once every several months  He does report significant workup in the past including numerous Holter monitors demonstrating tachycardia episodes,  Prior stress testing , all of which were unrevealing per the patient.  He has never had a monitor in place when he had an episode.    PMH:   has a past medical history of Clotting disorder ([place]), Coronary artery disease, ED (erectile dysfunction), Glaucoma, Hyperlipidemia, Hypertension, Hypotestosteronism, MI  (myocardial infarction) ([place]), Osteoarthritis, Raynaud's disease, SVT (supraventricular tachycardia) ([place]), and Syncope and collapse.  PSH:    Past Surgical History:  Procedure Laterality Date  . CARDIAC CATHETERIZATION    . CERVICAL SPINE SURGERY    . CORONARY ANGIOPLASTY WITH STENT PLACEMENT  02/29/2012   [facility] placed the stent 2.5 mm x 18 mm Ref # [id number] LOT # [id number]  . lung cyst removed     . TONSILLECTOMY    . TRACHEAL SURGERY      Current Outpatient Medications  Medication Sig Dispense Refill  . aspirin 81 MG chewable tablet Chew 81 mg by mouth daily.     Marland Kitchen atorvastatin (LIPITOR) 40 MG tablet TAKE ONE TABLET BY MOUTH EVERY DAY. PATIENT NEEDS APPOINTMENT FOR FUTURE REFILLS. 30 tablet 0  . lisinopril (ZESTRIL) 10 MG tablet Take 1 tablet (10 mg total) by mouth daily. 30 tablet 0  . timolol (TIMOPTIC) 0.5 % ophthalmic solution Place 1 drop into both eyes daily.      No current facility-administered medications for this visit.     Allergies:   Cefazolin and Clindamycin   Social History:  The patient  reports that he has quit smoking. His smoking use included cigarettes. He has a 10.00 pack-year smoking history. He has never used smokeless tobacco. He reports that he does not drink alcohol or use drugs.   Family History:   family history includes Heart attack (age of onset: 52) in his mother.    Review of Systems: Review of Systems  Constitutional:  Negative.        Rare episodes of sweating  HENT: Negative.   Respiratory: Negative.   Cardiovascular: Negative.   Gastrointestinal: Negative.        Dysphagia  Musculoskeletal: Positive for falls.       Poor balance  Neurological: Negative.   Psychiatric/Behavioral: Negative.   All other systems reviewed and are negative.   PHYSICAL EXAM: VS:  BP (!) 144/72 (BP Location: Left Arm, Patient Position: Sitting, Cuff Size: Normal)   Pulse 92   Ht 6' (1.829 m)   Wt 163 lb (73.9 kg)   BMI 22.11 kg/m  , BMI Body mass  index is 22.11 kg/m. Constitutional:  oriented to person, place, and time. No distress.  HENT:  Head: Grossly normal Eyes:  no discharge. No scleral icterus.  Neck: No JVD, no carotid bruits  Cardiovascular: Regular rate and rhythm, no murmurs appreciated Pulmonary/Chest: Clear to auscultation bilaterally, no wheezes or rails Abdominal: Soft.  no distension.  no tenderness.  Musculoskeletal: Normal range of motion Neurological:  normal muscle tone. Coordination normal. No atrophy Skin: Skin warm and dry Psychiatric: normal affect, pleasant   Recent Labs: 07/27/2018: ALT 20; BUN 21; Creatinine, Ser 1.08; Hemoglobin 14.4; Platelets 252; Potassium 4.3; Sodium 140    Lipid Panel No results found for: CHOL, HDL, LDLCALC, TRIG    Wt Readings from Last 3 Encounters:  05/28/19 163 lb (73.9 kg)  07/27/18 170 lb (77.1 kg)  03/14/18 167 lb 12 oz (76.1 kg)      ASSESSMENT AND PLAN:  Coronary artery disease involving native coronary artery of native heart with angina pectoris with documented spasm ([facility]) -  Currently with no symptoms of angina. No further workup at this time. Continue current medication regimen. stable  Hyperlipidemia, unspecified hyperlipidemia type - Plan: EKG 12-Lead Defer labs to PMD, none since 2018  S/P coronary artery stent placement - Plan: EKG 12-Lead  stent with him placed May 2013 OM vessel Continue aspirin and statin No angina  Essential hypertension Blood pressure is well controlled on today's visit. No changes made to the medications.  Excessive sweating Prior workup for episodes of sweating. Feels it is from low sugar Better with pepsi, crackers Rare episodes   Total encounter time more than 25 minutes  Greater than 50% was spent in counseling and coordination of care with the patient  Disposition:   F/U  12 months   No orders of the defined types were placed in this encounter.    Signed, Esmond Plants, M.D., Ph.D. 05/28/2019  [place], [place]

## 2019-05-28 ENCOUNTER — Encounter: Payer: Self-pay | Admitting: Cardiovascular Disease

## 2019-05-28 ENCOUNTER — Ambulatory Visit (INDEPENDENT_AMBULATORY_CARE_PROVIDER_SITE_OTHER): Payer: Medicare Other | Admitting: Cardiovascular Disease

## 2019-05-28 ENCOUNTER — Other Ambulatory Visit: Payer: Self-pay

## 2019-05-28 VITALS — BP 144/72 | HR 92 | Ht 72.0 in | Wt 163.0 lb

## 2019-05-28 DIAGNOSIS — Z955 Presence of coronary angioplasty implant and graft: Secondary | ICD-10-CM | POA: Diagnosis not present

## 2019-05-28 DIAGNOSIS — E785 Hyperlipidemia, unspecified: Secondary | ICD-10-CM | POA: Diagnosis not present

## 2019-05-28 DIAGNOSIS — I159 Secondary hypertension, unspecified: Secondary | ICD-10-CM | POA: Diagnosis not present

## 2019-05-28 DIAGNOSIS — I25111 Atherosclerotic heart disease of native coronary artery with angina pectoris with documented spasm: Secondary | ICD-10-CM

## 2019-05-28 DIAGNOSIS — R531 Weakness: Secondary | ICD-10-CM

## 2019-05-28 MED ORDER — ATORVASTATIN CALCIUM 40 MG PO TABS: 40.0000 mg | ORAL_TABLET | Freq: Every day | ORAL | 3 refills | Status: DC

## 2019-05-28 MED ORDER — LISINOPRIL 10 MG PO TABS: 10.0000 mg | ORAL_TABLET | Freq: Every day | ORAL | 3 refills | Status: DC

## 2019-05-28 NOTE — Patient Instructions (Addendum)
Medication Instructions:  No changes Refills sent to [facility]   If you need a refill on your cardiac medications before your next appointment, please call your pharmacy.    Lab work: No new labs needed   If you have labs (blood work) drawn today and your tests are completely normal, you will receive your results only by: Marland Kitchen MyChart Message (if you have MyChart) OR . A paper copy in the mail If you have any lab test that is abnormal or we need to change your treatment, we will call you to review the results.   Testing/Procedures: No new testing needed   Follow-Up: At [facility], you and your health needs are our priority.  As part of our continuing mission to provide you with exceptional heart care, we have created designated Provider Care Teams.  These Care Teams include your primary Cardiologist (physician) and Advanced Practice Providers (APPs -  Physician Assistants and Nurse Practitioners) who all work together to provide you with the care you need, when you need it.  . You will need a follow up appointment in 12 months .   Please call our office 2 months in advance to schedule this appointment.    . Providers on your designated Care Team:   . Murray Hodgkins, NP . Christell Faith, PA-C . Marrianne Mood, PA-C  Any Other Special Instructions Will Be Listed Below (If Applicable).  For educational health videos Log in to : [email or web address] Or : [email or web address], password : [email or web address]

## 2019-07-16 ENCOUNTER — Other Ambulatory Visit: Payer: Self-pay

## 2019-07-16 ENCOUNTER — Ambulatory Visit: Payer: Medicare Other

## 2019-07-16 DIAGNOSIS — Z23 Encounter for immunization: Secondary | ICD-10-CM

## 2020-06-01 ENCOUNTER — Other Ambulatory Visit: Payer: Self-pay

## 2020-06-03 ENCOUNTER — Other Ambulatory Visit: Payer: Self-pay

## 2020-06-03 ENCOUNTER — Encounter: Payer: Self-pay | Admitting: Nurse Practitioner

## 2020-06-03 ENCOUNTER — Ambulatory Visit (INDEPENDENT_AMBULATORY_CARE_PROVIDER_SITE_OTHER): Payer: Medicare Other | Admitting: Nurse Practitioner

## 2020-06-03 VITALS — BP 110/70 | HR 70 | Ht 72.0 in | Wt 153.0 lb

## 2020-06-03 DIAGNOSIS — I10 Essential (primary) hypertension: Secondary | ICD-10-CM

## 2020-06-03 DIAGNOSIS — E785 Hyperlipidemia, unspecified: Secondary | ICD-10-CM | POA: Diagnosis not present

## 2020-06-03 DIAGNOSIS — R5382 Chronic fatigue, unspecified: Secondary | ICD-10-CM

## 2020-06-03 DIAGNOSIS — I2510 Atherosclerotic heart disease of native coronary artery without angina pectoris: Secondary | ICD-10-CM

## 2020-06-03 NOTE — Progress Notes (Signed)
Office Visit    Patient Name: Patrick Malone Date of Encounter: 06/03/2020  Primary Care Provider:  Juluis Pitch, MD Primary Cardiologist:  Ida Rogue, MD  Chief Complaint    86 y/o ? w/ a h/o CAD s/p prior PCI of the OM1 in 2013, HTN, HL, and periodic weak spells, who presents for f/u of CAD.  Past Medical History    Past Medical History:  Diagnosis Date  . Clotting disorder ([place])   . Coronary artery disease    a. 02/2012 PCI ([facility]): OM1 99% (2.5x18 Resolute DES).  . ED (erectile dysfunction)   . Glaucoma   . Hyperlipidemia   . Hypertension   . Hypotestosteronism   . MI (myocardial infarction) ([place])   . Osteoarthritis   . Raynaud's disease   . SVT (supraventricular tachycardia) ([place])   . Syncope and collapse    Past Surgical History:  Procedure Laterality Date  . CARDIAC CATHETERIZATION    . CERVICAL SPINE SURGERY    . CORONARY ANGIOPLASTY WITH STENT PLACEMENT  02/29/2012   [facility] placed the stent 2.5 mm x 18 mm Ref # [id number] LOT # [id number]  . lung cyst removed     . TONSILLECTOMY    . TRACHEAL SURGERY      Allergies  Allergies  Allergen Reactions  . Cefazolin Diarrhea and Rash  . Clindamycin Diarrhea and Rash    History of Present Illness    86 y/o ? w/ a h/o CAD s/p prior PCI of the OM1 in 2013, HTN, HL, and periodic weak spells.  Per notes, re: weak spells, he has previously been evaluated ([facility]) with holter monitoring showing tachycardia episodes unrelated to symptoms.  Prior stress testing was also reportedly nl.  He was last seen in cardiology clinic in 05/2019.  Since that, he says that he has been "stable."  He has chronic fatigue and in that setting, his activity is limited.  Further limiting his activity is chronic neck and back pain associated with upper and lower extremity weakness.  He is able to ambulate short distances around his health using his cane and does not experience chest pain or dyspnea.  He occasionally notes a "flutter" in  his chest, which is very short-lived and otherwise asymptomatic.  He has a long history of apparent hypoglycemic episodes that typically occur if he misses lunch and are associated with fatigue/exhaustion, and occasionally mild diaphoresis.  Symptoms resolve fairly quickly with cheese crackers and Pepsi.  He denies PND, orthopnea, syncope, edema, or early satiety.  Home Medications    Prior to Admission medications   Medication Sig Start Date End Date Taking? Authorizing Provider  aspirin 81 MG chewable tablet Chew 81 mg by mouth daily.     [provider]  atorvastatin (LIPITOR) 40 MG tablet Take 1 tablet (40 mg total) by mouth daily. 05/28/19 08/26/19  Minna Merritts, MD  lisinopril (ZESTRIL) 10 MG tablet Take 1 tablet (10 mg total) by mouth daily. 05/28/19   Minna Merritts, MD  timolol (TIMOPTIC) 0.5 % ophthalmic solution Place 1 drop into both eyes daily.  02/05/14   [provider]    Review of Systems    Chronic fatigue and intermittent episodes of weakness/exhaustion sometimes associated diaphoresis, which she attributes to hypoglycemia.  Though symptoms improve with cheese crackers and Pepsi.  Occasional brief episodes of fluttering.  He denies chest pain, dyspnea, PND, orthopnea, dizziness, syncope, edema, or early satiety.  All other systems reviewed and are otherwise  negative except as noted above.  Physical Exam    VS:  BP 110/70 (BP Location: Left Arm, Patient Position: Sitting, Cuff Size: Normal)   Pulse 70   Ht 6' (1.829 m)   Wt 153 lb (69.4 kg)   BMI 20.75 kg/m  , BMI Body mass index is 20.75 kg/m. GEN: Well nourished, well developed, in no acute distress. HEENT: normal. Neck: Supple, no JVD, carotid bruits, or masses. Cardiac: RRR, distant heart sounds, no murmurs, rubs, or gallops. No clubbing, cyanosis, edema.  PT 2+ and equal bilaterally.  Respiratory:  Respirations regular and unlabored, clear to auscultation bilaterally. GI: Soft, nontender,  nondistended, BS + x 4. MS: no deformity or atrophy. Skin: warm and dry, no rash. Neuro:  Strength and sensation are intact. Psych: Normal affect.  Accessory Clinical Findings    ECG personally reviewed by me today -regular sinus rhythm, 70, peaked T waves, no acute ST changes.  Peaked T waves seen on prior ECGs as well- no acute changes.  Lab Results  Component Value Date   WBC 11.6 (H) 07/27/2018   HGB 14.4 07/27/2018   HCT 44.9 07/27/2018   MCV 90.2 07/27/2018   PLT 252 07/27/2018   Lab Results  Component Value Date   CREATININE 1.08 07/27/2018   BUN 21 07/27/2018   NA 140 07/27/2018   K 4.3 07/27/2018   CL 101 07/27/2018   CO2 29 07/27/2018   Lab Results  Component Value Date   ALT 20 07/27/2018   AST 20 07/27/2018   ALKPHOS 88 07/27/2018   BILITOT 1.4 (H) 07/27/2018    Assessment & Plan    1.  Coronary artery disease: Status post stenting of the first obtuse marginal at [facility] in 2013.  He has not had any chest pain or significant dyspnea over the past year.  ECG is unchanged from prior and vital signs are stable.  He remains on aspirin and statin therapy.  2.  Essential hypertension: Stable on ACE inhibitor therapy.  He is due for refill and says he has not had labs in over a year.  I will follow-up a basic metabolic panel today.  3.  Hyperlipidemia: He remains on Lipitor therapy.  Follow-up LFTs and lipids today.  4.  Chronic fatigue: Relatively unchanged over the past year.  We will follow up a CBC in addition to labs above.  5.  Peaked T waves: Seen on ECG today as well as prior ECG.  As he is due for refill on his lisinopril, will follow-up basic metabolic panel to assess renal function and potassium.  6.  Disposition: Follow-up CBC, complete metabolic panel, and lipids today.  Follow-up in clinic in 1 year or sooner if necessary.   Murray Hodgkins, NP 06/03/2020, 10:00 AM

## 2020-06-03 NOTE — Patient Instructions (Signed)
Medication Instructions:  Your physician recommends that you continue on your current medications as directed. Please refer to the Current Medication list given to you today.  *If you need a refill on your cardiac medications before your next appointment, please call your pharmacy*   Lab Work: Your physician recommends that you have lab work today(CBC, CMET, Lipids)  If you have labs (blood work) drawn today and your tests are completely normal, you will receive your results only by:  MyChart Message (if you have MyChart) OR  A paper copy in the mail If you have any lab test that is abnormal or we need to change your treatment, we will call you to review the results.   Testing/Procedures: None ordered   Follow-Up: At [facility], you and your health needs are our priority.  As part of our continuing mission to provide you with exceptional heart care, we have created designated Provider Care Teams.  These Care Teams include your primary Cardiologist (physician) and Advanced Practice Providers (APPs -  Physician Assistants and Nurse Practitioners) who all work together to provide you with the care you need, when you need it.  We recommend signing up for the patient portal called "MyChart".  Sign up information is provided on this After Visit Summary.  MyChart is used to connect with patients for Virtual Visits (Telemedicine).  Patients are able to view lab/test results, encounter notes, upcoming appointments, etc.  Non-urgent messages can be sent to your provider as well.   To learn more about what you can do with MyChart, go to [email or web address].    Your next appointment:   12 month(s)  The format for your next appointment:   In Person  Provider:    You may see Ida Rogue, MD or Murray Hodgkins,

## 2020-06-04 ENCOUNTER — Telehealth: Payer: Self-pay

## 2020-06-04 LAB — COMPREHENSIVE METABOLIC PANEL
ALT: 14 IU/L (ref 0–44)
AST: 22 IU/L (ref 0–40)
Albumin/Globulin Ratio: 2 (ref 1.2–2.2)
Albumin: 4.9 g/dL — ABNORMAL HIGH (ref 3.6–4.6)
Alkaline Phosphatase: 107 IU/L (ref 48–121)
BUN/Creatinine Ratio: 17 (ref 10–24)
BUN: 16 mg/dL (ref 8–27)
Bilirubin Total: 0.7 mg/dL (ref 0.0–1.2)
CO2: 25 mmol/L (ref 20–29)
Calcium: 9.9 mg/dL (ref 8.6–10.2)
Chloride: 100 mmol/L (ref 96–106)
Creatinine, Ser: 0.96 mg/dL (ref 0.76–1.27)
GFR calc Af Amer: 83 mL/min/{1.73_m2} (ref >59)
GFR calc non Af Amer: 72 mL/min/{1.73_m2} (ref >59)
Globulin, Total: 2.4 g/dL (ref 1.5–4.5)
Glucose: 98 mg/dL (ref 65–99)
Potassium: 4.9 mmol/L (ref 3.5–5.2)
Sodium: 141 mmol/L (ref 134–144)
Total Protein: 7.3 g/dL (ref 6.0–8.5)

## 2020-06-04 LAB — CBC
Hematocrit: 43.5 % (ref 37.5–51.0)
Hemoglobin: 14.7 g/dL (ref 13.0–17.7)
MCH: 29.5 pg (ref 26.6–33.0)
MCHC: 33.8 g/dL (ref 31.5–35.7)
MCV: 87 fL (ref 79–97)
Platelets: 303 10*3/uL (ref 150–450)
RBC: 4.99 x10E6/uL (ref 4.14–5.80)
RDW: 13.2 % (ref 11.6–15.4)
WBC: 7.1 10*3/uL (ref 3.4–10.8)

## 2020-06-04 LAB — LIPID PANEL
Chol/HDL Ratio: 3.2 ratio (ref 0.0–5.0)
Cholesterol, Total: 155 mg/dL (ref 100–199)
HDL: 49 mg/dL (ref >39)
LDL Chol Calc (NIH): 93 mg/dL (ref 0–99)
Triglycerides: 68 mg/dL (ref 0–149)
VLDL Cholesterol Cal: 13 mg/dL (ref 5–40)

## 2020-06-04 NOTE — Telephone Encounter (Signed)
No. Thank you.

## 2020-06-04 NOTE — Telephone Encounter (Signed)
-----   Message from Theora Gianotti, NP sent at 06/04/2020  7:43 AM EDT ----- Blood counts nl.  Kidney function, lytes, liver function nl.  Lipids could be a little better.  LDL is 93 (goal <70).  If he'd consider increasing lipitor to 80mg  daily, we should w/ plan for f/u lipids/lft's in 6 wks.

## 2020-06-04 NOTE — Telephone Encounter (Signed)
Call to patient to review labs.    Pt does not want to increase medication at this time.  He prefers to try dietary changes.     Advised pt to call for any further questions or concerns.  No further orders.   Routing to Ignacia Bayley, NP as Juluis Rainier

## 2020-06-18 ENCOUNTER — Other Ambulatory Visit: Payer: Self-pay

## 2020-06-18 MED ORDER — LISINOPRIL 10 MG PO TABS: 10.0000 mg | ORAL_TABLET | Freq: Every day | ORAL | 3 refills | Status: DC

## 2021-01-19 ENCOUNTER — Other Ambulatory Visit: Payer: Self-pay | Admitting: Gastroenterology

## 2021-01-19 DIAGNOSIS — R1310 Dysphagia, unspecified: Secondary | ICD-10-CM

## 2021-02-01 ENCOUNTER — Other Ambulatory Visit: Payer: Self-pay

## 2021-02-01 ENCOUNTER — Ambulatory Visit
Admission: RE | Admit: 2021-02-01 | Discharge: 2021-02-01 | Disposition: A | Discharge status: Home / Self Care | Payer: Medicare Other | Source: Ambulatory Visit | Attending: Gastroenterology | Admitting: Gastroenterology

## 2021-02-01 DIAGNOSIS — R1310 Dysphagia, unspecified: Secondary | ICD-10-CM | POA: Insufficient documentation

## 2021-02-09 ENCOUNTER — Encounter: Payer: Self-pay | Admitting: Dermatology

## 2021-02-09 ENCOUNTER — Ambulatory Visit: Payer: Medicare Other | Admitting: Dermatology

## 2021-02-09 ENCOUNTER — Other Ambulatory Visit: Payer: Self-pay

## 2021-02-09 DIAGNOSIS — L820 Inflamed seborrheic keratosis: Secondary | ICD-10-CM

## 2021-02-09 DIAGNOSIS — D485 Neoplasm of uncertain behavior of skin: Secondary | ICD-10-CM

## 2021-02-09 DIAGNOSIS — L570 Actinic keratosis: Secondary | ICD-10-CM

## 2021-02-09 DIAGNOSIS — L821 Other seborrheic keratosis: Secondary | ICD-10-CM | POA: Diagnosis not present

## 2021-02-09 DIAGNOSIS — L578 Other skin changes due to chronic exposure to nonionizing radiation: Secondary | ICD-10-CM | POA: Diagnosis not present

## 2021-02-09 NOTE — Progress Notes (Signed)
New Patient Visit  Subjective  Patrick Malone is a 87 y.o. male who presents for the following: Other (Scaly spots of scalp, face and hands).  Referred by Dr. Lovie Macadamia  The following portions of the chart were reviewed this encounter and updated as appropriate:   Tobacco  Allergies  Meds  Problems  Med Hx  Surg Hx  Fam Hx     Review of Systems:  No other skin or systemic complaints except as noted in HPI or Assessment and Plan.  Objective  Well appearing patient in no apparent distress; mood and affect are within normal limits.  A focused examination was performed including scalp, face. Relevant physical exam findings are noted in the Assessment and Plan.  Objective  Scalp/forehead (16): Erythematous thin papules/macules with gritty scale.   Objective  Right nose: Crust  Objective  Mid Forehead: Erythematous keratotic or waxy stuck-on papule or plaque.   Objective  Right Abdomen (side) - Lower: Stuck-on, waxy, tan-brown papule or plaque --Discussed benign etiology and prognosis.    Assessment & Plan  AK (actinic keratosis) (16) Scalp/forehead  Actinic Damage - Severe, confluent actinic changes with pre-cancerous actinic keratoses  - Severe, chronic, not at goal, secondary to cumulative UV radiation exposure over time - diffuse scaly erythematous macules and papules with underlying dyspigmentation - Discussed Prescription "Field Treatment" for Severe, Chronic Confluent Actinic Changes with Pre-Cancerous Actinic Keratoses Field treatment involves treatment of an entire area of skin that has confluent Actinic Changes (Sun/ Ultraviolet light damage) and PreCancerous Actinic Keratoses by method of PhotoDynamic Therapy (PDT) and/or prescription Topical Chemotherapy agents such as 5-fluorouracil, 5-fluorouracil/calcipotriene, and/or imiquimod.  The purpose is to decrease the number of clinically evident and subclinical PreCancerous lesions to prevent progression to  development of skin cancer by chemically destroying early precancer changes that may or may not be visible.  It has been shown to reduce the risk of developing skin cancer in the treated area. As a result of treatment, redness, scaling, crusting, and open sores may occur during treatment course. One or more than one of these methods may be used and may have to be used several times to control, suppress and eliminate the PreCancerous changes. Discussed treatment course, expected reaction, and possible side effects. - Recommend daily broad spectrum sunscreen SPF 30+ to sun-exposed areas, reapply every 2 hours as needed.  - Staying in the shade or wearing long sleeves, sun glasses (UVA+UVB protection) and wide brim hats (4-inch brim around the entire circumference of the hat) are also recommended. - Call for new or changing lesions.  Will plan PDT to scalp and forehead   Destruction of lesion - Scalp/forehead Complexity: simple   Destruction method: cryotherapy   Informed consent: discussed and consent obtained   Timeout:  patient name, date of birth, surgical site, and procedure verified Lesion destroyed using liquid nitrogen: Yes   Region frozen until ice ball extended beyond lesion: Yes   Outcome: patient tolerated procedure well with no complications   Post-procedure details: wound care instructions given    Neoplasm of uncertain behavior of skin Right nose  R/O BCC - will plan biopsy on follow up  Inflamed seborrheic keratosis Mid Forehead  Destruction of lesion - Mid Forehead Complexity: simple   Destruction method: cryotherapy   Informed consent: discussed and consent obtained   Timeout:  patient name, date of birth, surgical site, and procedure verified Lesion destroyed using liquid nitrogen: Yes   Region frozen until ice ball extended beyond lesion: Yes  Outcome: patient tolerated procedure well with no complications   Post-procedure details: wound care instructions given     Seborrheic keratosis Right Abdomen (side) - Lower  Benign, observe.    Return in about 2 weeks (around 02/23/2021) for for biopsy, 5 weeks for PDT of scalp and forehead.  I, Ashok Cordia, CMA, am acting as scribe for [name], MD .  Documentation: I have reviewed the above documentation for accuracy and completeness, and I agree with the above.  [name], MD

## 2021-02-09 NOTE — Patient Instructions (Addendum)
Cryotherapy Aftercare  . Wash gently with soap and water everyday.   Marland Kitchen Apply Vaseline and Band-Aid daily until healed.   If you have any questions or concerns for your doctor, please call our main line at [phone] and press option 4 to reach your doctor's medical assistant. If no one answers, please leave a voicemail as directed and we will return your call as soon as possible. Messages left after 4 pm will be answered the following business day.   You may also send [place] a message via [place]. We typically respond to MyChart messages within 1-2 business days.  For prescription refills, please ask your pharmacy to contact our office. Our fax number is [phone].  If you have an urgent issue when the clinic is closed that cannot wait until the next business day, you can page your doctor at the number below.    Please note that while we do our best to be available for urgent issues outside of office hours, we are not available 24/7.   If you have an urgent issue and are unable to reach [place], you may choose to seek medical care at your doctor's office, retail clinic, urgent care center, or emergency room.  If you have a medical emergency, please immediately call 911 or go to the emergency department.  Pager Numbers  - Dr. Nehemiah Massed: [phone]  - Dr. Laurence Ferrari: [phone]  - Dr. Nicole Kindred: [phone]  In the event of inclement weather, please call our main line at [phone] for an update on the status of any delays or closures.  Dermatology Medication Tips: Please keep the boxes that topical medications come in in order to help keep track of the instructions about where and how to use these. Pharmacies typically print the medication instructions only on the boxes and not directly on the medication tubes.   If your medication is too expensive, please contact our office at [phone] option 4 or send [place] a message through [place].   We are unable to tell what your co-pay for  medications will be in advance as this is different depending on your insurance coverage. However, we may be able to find a substitute medication at lower cost or fill out paperwork to get insurance to cover a needed medication.   If a prior authorization is required to get your medication covered by your insurance company, please allow [place] 1-2 business days to complete this process.  Drug prices often vary depending on where the prescription is filled and some pharmacies may offer cheaper prices.  The website www.goodrx.com contains coupons for medications through different pharmacies. The prices here do not account for what the cost may be with help from insurance (it may be cheaper with your insurance), but the website can give you the price if you did not use any insurance.  - You can print the associated coupon and take it with your prescription to the pharmacy.  - You may also stop by our office during regular business hours and pick up a GoodRx coupon card.  - If you need your prescription sent electronically to a different pharmacy, notify our office through [facility] or by phone at [phone].

## 2021-02-16 ENCOUNTER — Encounter: Payer: Self-pay | Admitting: Dermatology

## 2021-02-24 ENCOUNTER — Other Ambulatory Visit: Payer: Self-pay

## 2021-02-24 ENCOUNTER — Ambulatory Visit: Payer: Medicare Other | Admitting: Dermatology

## 2021-02-24 DIAGNOSIS — L570 Actinic keratosis: Secondary | ICD-10-CM | POA: Diagnosis not present

## 2021-02-24 DIAGNOSIS — L820 Inflamed seborrheic keratosis: Secondary | ICD-10-CM | POA: Diagnosis not present

## 2021-02-24 DIAGNOSIS — D485 Neoplasm of uncertain behavior of skin: Secondary | ICD-10-CM | POA: Diagnosis not present

## 2021-02-24 DIAGNOSIS — C4491 Basal cell carcinoma of skin, unspecified: Secondary | ICD-10-CM

## 2021-02-24 HISTORY — DX: Basal cell carcinoma of skin, unspecified: C44.91

## 2021-02-24 NOTE — Patient Instructions (Signed)
If you have any questions or concerns for your doctor, please call our main line at [phone] and press option 4 to reach your doctor's medical assistant. If no one answers, please leave a voicemail as directed and we will return your call as soon as possible. Messages left after 4 pm will be answered the following business day.   You may also send us a message via MyChart. We typically respond to MyChart messages within 1-2 business days.  For prescription refills, please ask your pharmacy to contact our office. Our fax number is [phone].  If you have an urgent issue when the clinic is closed that cannot wait until the next business day, you can page your doctor at the number below.    Please note that while we do our best to be available for urgent issues outside of office hours, we are not available 24/7.   If you have an urgent issue and are unable to reach us, you may choose to seek medical care at your doctor's office, retail clinic, urgent care center, or emergency room.  If you have a medical emergency, please immediately call 911 or go to the emergency department.  Pager Numbers  - Dr. Kowalski: [phone]  - Dr. Moye: [phone]  - Dr. Stewart: [phone]  In the event of inclement weather, please call our main line at [phone] for an update on the status of any delays or closures.  Dermatology Medication Tips: Please keep the boxes that topical medications come in in order to help keep track of the instructions about where and how to use these. Pharmacies typically print the medication instructions only on the boxes and not directly on the medication tubes.   If your medication is too expensive, please contact our office at [phone] option 4 or send us a message through MyChart.   We are unable to tell what your co-pay for medications will be in advance as this is different depending on your insurance coverage. However, we may be able to find a  substitute medication at lower cost or fill out paperwork to get insurance to cover a needed medication.   If a prior authorization is required to get your medication covered by your insurance company, please allow us 1-2 business days to complete this process.  Drug prices often vary depending on where the prescription is filled and some pharmacies may offer cheaper prices.  The website www.goodrx.com contains coupons for medications through different pharmacies. The prices here do not account for what the cost may be with help from insurance (it may be cheaper with your insurance), but the website can give you the price if you did not use any insurance.  - You can print the associated coupon and take it with your prescription to the pharmacy.  - You may also stop by our office during regular business hours and pick up a GoodRx coupon card.  - If you need your prescription sent electronically to a different pharmacy, notify our office through [place] MyChart or by phone at [phone] option 4.     Wound Care Instructions  1. Cleanse wound gently with soap and water once a day then pat dry with clean gauze. Apply a thing coat of Petrolatum (petroleum jelly, "Vaseline") over the wound (unless you have an allergy to this). We recommend that you use a new, sterile tube of Vaseline. Do not pick or remove scabs. Do not remove the yellow or white "healing tissue" from the base of the wound.    2. Cover the wound with fresh, clean, nonstick gauze and secure with paper tape. You may use Band-Aids in place of gauze and tape if the would is small enough, but would recommend trimming much of the tape off as there is often too much. Sometimes Band-Aids can irritate the skin.  3. You should call the office for your biopsy report after 1 week if you have not already been contacted.  4. If you experience any problems, such as abnormal amounts of bleeding, swelling, significant bruising, significant pain,  or evidence of infection, please call the office immediately.  5. FOR ADULT SURGERY PATIENTS: If you need something for pain relief you may take 1 extra strength Tylenol (acetaminophen) AND 2 Ibuprofen (200mg each) together every 4 hours as needed for pain. (do not take these if you are allergic to them or if you have a reason you should not take them.) Typically, you may only need pain medication for 1 to 3 days.     

## 2021-02-24 NOTE — Progress Notes (Signed)
Follow-Up Visit   Subjective  Patrick Malone is a 87 y.o. male who presents for the following: skin lesion (On the right nose - irregular appearing, patient is here today for a biopsy ).  The following portions of the chart were reviewed this encounter and updated as appropriate:   Tobacco  Allergies  Meds  Problems  Med Hx  Surg Hx  Fam Hx     Review of Systems:  No other skin or systemic complaints except as noted in HPI or Assessment and Plan.  Objective  Well appearing patient in no apparent distress; mood and affect are within normal limits.  A focused examination was performed including the face. Relevant physical exam findings are noted in the Assessment and Plan.  Objective  R nose: Atropic indurated plaque 0.8 cm      Objective  Chest x 5: Erythematous keratotic or waxy stuck-on papule or plaque.   Objective  Hands x 16 (16): Erythematous thin papules/macules with gritty scale.    Assessment & Plan  Neoplasm of uncertain behavior of skin R nose  Skin / nail biopsy Type of biopsy: tangential   Informed consent: discussed and consent obtained   Timeout: patient name, date of birth, surgical site, and procedure verified   Procedure prep:  Patient was prepped and draped in usual sterile fashion Prep type:  Isopropyl alcohol Anesthesia: the lesion was anesthetized in a standard fashion   Anesthetic:  1% lidocaine w/ epinephrine 1-100,000 buffered w/ 8.4% NaHCO3 Instrument used: flexible razor blade   Hemostasis achieved with: pressure, aluminum chloride and electrodesiccation   Outcome: patient tolerated procedure well   Post-procedure details: sterile dressing applied and wound care instructions given   Dressing type: bandage and petrolatum    Specimen 1 - Surgical pathology Differential Diagnosis: D48.5 r/o BCC  Check Margins: No Pink papule 0.8 cm  Inflamed seborrheic keratosis Chest x 5  Destruction of lesion - Chest x 5 Complexity:  simple   Destruction method: cryotherapy   Informed consent: discussed and consent obtained   Timeout:  patient name, date of birth, surgical site, and procedure verified Lesion destroyed using liquid nitrogen: Yes   Region frozen until ice ball extended beyond lesion: Yes   Outcome: patient tolerated procedure well with no complications   Post-procedure details: wound care instructions given    AK (actinic keratosis) (16) Hands x 16  Destruction of lesion - Hands x 16 Complexity: simple   Destruction method: cryotherapy   Informed consent: discussed and consent obtained   Timeout:  patient name, date of birth, surgical site, and procedure verified Lesion destroyed using liquid nitrogen: Yes   Region frozen until ice ball extended beyond lesion: Yes   Outcome: patient tolerated procedure well with no complications   Post-procedure details: wound care instructions given    Actinic Damage - chronic, secondary to cumulative UV radiation exposure/sun exposure over time - diffuse scaly erythematous macules with underlying dyspigmentation - Recommend daily broad spectrum sunscreen SPF 30+ to sun-exposed areas, reapply every 2 hours as needed.  - Recommend staying in the shade or wearing long sleeves, sun glasses (UVA+UVB protection) and wide brim hats (4-inch brim around the entire circumference of the hat). - Call for new or changing lesions.  Return in about 4 months (around 06/27/2021) for AK follow up, recheck bx site.  Luther Redo, CMA, am acting as scribe for [name], MD .  Documentation: I have reviewed the above documentation for accuracy and completeness, and  I agree with the above.  [name], MD

## 2021-03-01 ENCOUNTER — Encounter: Payer: Self-pay | Admitting: Dermatology

## 2021-03-08 ENCOUNTER — Telehealth: Payer: Self-pay

## 2021-03-08 NOTE — Telephone Encounter (Signed)
Left message with male for patient to return my call.

## 2021-03-08 NOTE — Telephone Encounter (Signed)
-----   Message from Ralene Bathe, MD sent at 03/05/2021 12:45 PM EDT ----- Diagnosis Skin , R nose BASAL CELL CARCINOMA, NODULAR PATTERN, PERIPHERAL AND DEEP MARGINS INVOLVED  Cancer - BCC Schedule for MOHS (if pt not interested in MOHS, we could consider EDC)

## 2021-03-09 ENCOUNTER — Telehealth: Payer: Self-pay

## 2021-03-09 ENCOUNTER — Ambulatory Visit (INDEPENDENT_AMBULATORY_CARE_PROVIDER_SITE_OTHER): Payer: Medicare Other

## 2021-03-09 ENCOUNTER — Other Ambulatory Visit: Payer: Self-pay

## 2021-03-09 DIAGNOSIS — L570 Actinic keratosis: Secondary | ICD-10-CM | POA: Diagnosis not present

## 2021-03-09 MED ORDER — AMINOLEVULINIC ACID HCL 20 % EX SOLR
1.0000 "application " | Freq: Once | CUTANEOUS | Status: AC
  Administered 2021-03-09: 354 mg via TOPICAL

## 2021-03-09 NOTE — Patient Instructions (Signed)

## 2021-03-09 NOTE — Progress Notes (Signed)
Patient completed PDT therapy today.  1. AK (actinic keratosis) Scalp  Photodynamic therapy - Scalp Procedure discussed: discussed risks, benefits, side effects. and alternatives   Prep: site scrubbed/prepped with acetone   Location:  Scalp and forehead Number of lesions:  Multiple Type of treatment:  Blue light Aminolevulinic Acid (see MAR for details): Levulan Number of minutes under lamp:  16 Number of seconds under lamp:  40 Cooling:  Floor fan Outcome: patient tolerated procedure well with no complications   Post-procedure details: sunscreen applied    Aminolevulinic Acid HCl 20 % SOLR 354 mg - Scalp

## 2021-03-09 NOTE — Telephone Encounter (Signed)
-----   Message from Ralene Bathe, MD sent at 03/05/2021 12:45 PM EDT ----- Diagnosis Skin , R nose BASAL CELL CARCINOMA, NODULAR PATTERN, PERIPHERAL AND DEEP MARGINS INVOLVED  Cancer - BCC Schedule for MOHS (if pt not interested in MOHS, we could consider EDC)

## 2021-03-09 NOTE — Telephone Encounter (Signed)
Spoke with patient during PDT appointment today. I went over results and treatments in great detail. Patient is not interested in [facility] and traveling due to age. He has been scheduled for [facility] June 22nd.

## 2021-03-15 NOTE — Addendum Note (Signed)
Encounter addended by: Annie Paras on: 03/15/2021 2:03 PM  Actions taken: Letter saved

## 2021-03-24 ENCOUNTER — Ambulatory Visit: Payer: Medicare Other | Admitting: Dermatology

## 2021-03-24 ENCOUNTER — Other Ambulatory Visit: Payer: Self-pay

## 2021-03-24 ENCOUNTER — Encounter: Payer: Self-pay | Admitting: Dermatology

## 2021-03-24 DIAGNOSIS — L578 Other skin changes due to chronic exposure to nonionizing radiation: Secondary | ICD-10-CM

## 2021-03-24 DIAGNOSIS — C44311 Basal cell carcinoma of skin of nose: Secondary | ICD-10-CM | POA: Diagnosis not present

## 2021-03-24 NOTE — Patient Instructions (Signed)
If you have any questions or concerns for your doctor, please call our main line at [phone] and press option 4 to reach your doctor's medical assistant. If no one answers, please leave a voicemail as directed and we will return your call as soon as possible. Messages left after 4 pm will be answered the following business day.   You may also send us a message via MyChart. We typically respond to MyChart messages within 1-2 business days.  For prescription refills, please ask your pharmacy to contact our office. Our fax number is [phone].  If you have an urgent issue when the clinic is closed that cannot wait until the next business day, you can page your doctor at the number below.    Please note that while we do our best to be available for urgent issues outside of office hours, we are not available 24/7.   If you have an urgent issue and are unable to reach us, you may choose to seek medical care at your doctor's office, retail clinic, urgent care center, or emergency room.  If you have a medical emergency, please immediately call 911 or go to the emergency department.  Pager Numbers  - Dr. Kowalski: [phone]  - Dr. Moye: [phone]  - Dr. Stewart: [phone]  In the event of inclement weather, please call our main line at [phone] for an update on the status of any delays or closures.  Dermatology Medication Tips: Please keep the boxes that topical medications come in in order to help keep track of the instructions about where and how to use these. Pharmacies typically print the medication instructions only on the boxes and not directly on the medication tubes.   If your medication is too expensive, please contact our office at [phone] option 4 or send us a message through MyChart.   We are unable to tell what your co-pay for medications will be in advance as this is different depending on your insurance coverage. However, we may be able to find a substitute  medication at lower cost or fill out paperwork to get insurance to cover a needed medication.   If a prior authorization is required to get your medication covered by your insurance company, please allow us 1-2 business days to complete this process.  Drug prices often vary depending on where the prescription is filled and some pharmacies may offer cheaper prices.  The website www.goodrx.com contains coupons for medications through different pharmacies. The prices here do not account for what the cost may be with help from insurance (it may be cheaper with your insurance), but the website can give you the price if you did not use any insurance.  - You can print the associated coupon and take it with your prescription to the pharmacy.  - You may also stop by our office during regular business hours and pick up a GoodRx coupon card.  - If you need your prescription sent electronically to a different pharmacy, notify our office through [place] MyChart or by phone at [phone] option 4.   Wound Care Instructions  Cleanse wound gently with soap and water once a day then pat dry with clean gauze. Apply a thing coat of Petrolatum (petroleum jelly, "Vaseline") over the wound (unless you have an allergy to this). We recommend that you use a new, sterile tube of Vaseline. Do not pick or remove scabs. Do not remove the yellow or white "healing tissue" from the base of the wound.  Cover the   wound with fresh, clean, nonstick gauze and secure with paper tape. You may use Band-Aids in place of gauze and tape if the would is small enough, but would recommend trimming much of the tape off as there is often too much. Sometimes Band-Aids can irritate the skin.  You should call the office for your biopsy report after 1 week if you have not already been contacted.  If you experience any problems, such as abnormal amounts of bleeding, swelling, significant bruising, significant pain, or evidence of infection,  please call the office immediately.  FOR ADULT SURGERY PATIENTS: If you need something for pain relief you may take 1 extra strength Tylenol (acetaminophen) AND 2 Ibuprofen (200mg each) together every 4 hours as needed for pain. (do not take these if you are allergic to them or if you have a reason you should not take them.) Typically, you may only need pain medication for 1 to 3 days.    

## 2021-03-24 NOTE — Progress Notes (Signed)
   Follow-Up Visit   Subjective  Patrick Malone is a 87 y.o. male who presents for the following: BCC bx proven (R nose, pt presents for [facility]).  The following portions of the chart were reviewed this encounter and updated as appropriate:   Tobacco  Allergies  Meds  Problems  Med Hx  Surg Hx  Fam Hx      Review of Systems:  No other skin or systemic complaints except as noted in HPI or Assessment and Plan.  Objective  Well appearing patient in no apparent distress; mood and affect are within normal limits.  A focused examination was performed including right nose. Relevant physical exam findings are noted in the Assessment and Plan.  R nose Pink bx site 0.8cm   Assessment & Plan  Basal cell carcinoma (BCC) of skin of nose R nose  Destruction of lesion Complexity: extensive   Destruction method: electrodesiccation and curettage   Informed consent: discussed and consent obtained   Timeout:  patient name, date of birth, surgical site, and procedure verified Procedure prep:  Patient was prepped and draped in usual sterile fashion Prep type:  Isopropyl alcohol Anesthesia: the lesion was anesthetized in a standard fashion   Anesthetic:  1% lidocaine w/ epinephrine 1-100,000 buffered w/ 8.4% NaHCO3 Curettage performed in three different directions: Yes   Electrodesiccation performed over the curetted area: Yes   Lesion length (cm):  0.8 Lesion width (cm):  0.8 Margin per side (cm):  0.2 Final wound size (cm):  1.2 Hemostasis achieved with:  pressure, aluminum chloride and electrodesiccation Outcome: patient tolerated procedure well with no complications   Post-procedure details: sterile dressing applied and wound care instructions given   Dressing type: bandage and bacitracin    Bx proven  Actinic Damage - chronic, secondary to cumulative UV radiation exposure/sun exposure over time - diffuse scaly erythematous macules with underlying dyspigmentation - Recommend  daily broad spectrum sunscreen SPF 30+ to sun-exposed areas, reapply every 2 hours as needed.  - Recommend staying in the shade or wearing long sleeves, sun glasses (UVA+UVB protection) and wide brim hats (4-inch brim around the entire circumference of the hat). - Call for new or changing lesions.  Return for as scheduled for 43m AK f/u .  I, [name], RMA, am acting as scribe for [name], MD . Documentation: I have reviewed the above documentation for accuracy and completeness, and I agree with the above.  [name], MD

## 2021-03-30 ENCOUNTER — Encounter: Payer: Self-pay | Admitting: Dermatology

## 2021-04-22 ENCOUNTER — Other Ambulatory Visit: Payer: Self-pay | Admitting: Nurse Practitioner

## 2021-06-04 ENCOUNTER — Other Ambulatory Visit: Payer: Self-pay

## 2021-06-04 ENCOUNTER — Encounter: Payer: Self-pay | Admitting: Cardiovascular Disease

## 2021-06-04 ENCOUNTER — Ambulatory Visit: Payer: Medicare Other | Admitting: Cardiovascular Disease

## 2021-06-04 VITALS — BP 130/64 | HR 76 | Ht 72.0 in | Wt 151.0 lb

## 2021-06-04 DIAGNOSIS — I10 Essential (primary) hypertension: Secondary | ICD-10-CM | POA: Diagnosis not present

## 2021-06-04 DIAGNOSIS — E785 Hyperlipidemia, unspecified: Secondary | ICD-10-CM

## 2021-06-04 DIAGNOSIS — I2510 Atherosclerotic heart disease of native coronary artery without angina pectoris: Secondary | ICD-10-CM | POA: Diagnosis not present

## 2021-06-04 NOTE — Progress Notes (Signed)
Cardiology Office Note  Date:  06/04/2021   ID:  SREERAM CORSINO, DOB [birth year], MRN [id number]  PCP:  Juluis Pitch, MD   Chief Complaint  Patient presents with   12 month follow up     Patient c/o shortness of breath and twinges in chest at times. Medications reviewed by the patient verbally.     HPI:  Mr. Saggio  is a very pleasant 87 year old gentleman with a history of  coronary artery disease,   Cardiac catheterization May 2013 with Resolute  2.5 x 18 mm stent placed to his OM1 (99% to 0%),   Hyperlipidemia,  prior  Cervical surgery,   Periodic episodes of weakness, sweating  Who presents for routine follow-up of his coronary artery disease  LOV 05/2019 Seen by berge 06/2020  "Feel like I have parkinsons" Weight loss, 163 in 2020, now 150 Losing muscle Wife does cooking, chores, she is falling He drives, groceries Trouble picking up feet on pedals He has anorexia  Sits in the swing all day  Legs very weak, difficulty getting on exam table [place] recently, lost balance  denies any chest pain concerning for angina.  taking Lipitor 40 mg daily No recent lipid panel Total chol 127, LDL 72    No regular exercise program, limited by arthralgias  EKG personally reviewed by myself on todays visit Shows  normal sinus rhythm with rate 66 bpm, No significant ST or T-wave changes   Other past medical history reviewed Previous periodic episodes of weakness, sweating that can presents at any time though rare.  Episodes might be once every several months  He does report significant workup in the past including numerous Holter monitors demonstrating tachycardia episodes,  Prior stress testing , all of which were unrevealing per the patient.  He has never had a monitor in place when he had an episode.    PMH:   has a past medical history of Basal cell carcinoma (02/24/2021), Clotting disorder ([place]), Coronary artery disease, ED (erectile dysfunction), Glaucoma,  Hyperlipidemia, Hypertension, Hypotestosteronism, MI (myocardial infarction) ([place]), Osteoarthritis, Raynaud's disease, Skin cancer (years ago), SVT (supraventricular tachycardia) ([place]), and Syncope and collapse.  PSH:    Past Surgical History:  Procedure Laterality Date   CARDIAC CATHETERIZATION     CERVICAL SPINE SURGERY     CORONARY ANGIOPLASTY WITH STENT PLACEMENT  02/29/2012   [facility] placed the stent 2.5 mm x 18 mm Ref # [id number] LOT # [id number]   lung cyst removed      TONSILLECTOMY     TRACHEAL SURGERY      Current Outpatient Medications  Medication Sig Dispense Refill   aspirin 81 MG chewable tablet Chew 81 mg by mouth daily.      atorvastatin (LIPITOR) 40 MG tablet Take 1 tablet (40 mg total) by mouth daily. 90 tablet 3   lisinopril (ZESTRIL) 10 MG tablet Take 1 tablet (10 mg total) by mouth daily. KEEP OV. 90 tablet 0   Pediatric Multiple Vit-C-FA (CHEWABLE VITE CHILDRENS) CHEW Chew by mouth.     timolol (TIMOPTIC) 0.5 % ophthalmic solution Place 1 drop into both eyes daily.      No current facility-administered medications for this visit.    Allergies:   Cefazolin and Clindamycin   Social History:  The patient  reports that he has quit smoking. His smoking use included cigarettes. He has a 10.00 pack-year smoking history. He has never used smokeless tobacco. He reports that he does not drink alcohol and does not use drugs.  Family History:   family history includes Heart attack (age of onset: 45) in his mother.    Review of Systems: Review of Systems  Constitutional: Negative.   HENT: Negative.    Respiratory: Negative.    Cardiovascular: Negative.   Gastrointestinal: Negative.        Dysphagia  Musculoskeletal:        Poor balance, weakness  Neurological: Negative.   Psychiatric/Behavioral: Negative.    All other systems reviewed and are negative.  PHYSICAL EXAM: VS:  BP 130/64 (BP Location: Left Arm, Patient Position: Sitting, Cuff Size: Normal)   Pulse  76   Ht 6' (1.829 m)   Wt 151 lb (68.5 kg)   BMI 20.48 kg/m  , BMI Body mass index is 20.48 kg/m. Constitutional:  oriented to person, place, and time. No distress. thin HENT:  Head: Grossly normal Eyes:  no discharge. No scleral icterus.  Neck: No JVD, no carotid bruits  Cardiovascular: Regular rate and rhythm, no murmurs appreciated Pulmonary/Chest: Clear to auscultation bilaterally, no wheezes or rails Abdominal: Soft.  no distension.  no tenderness.  Musculoskeletal: Normal range of motion Neurological:  normal muscle tone. Coordination normal. No atrophy Skin: Skin warm and dry Psychiatric: normal affect, pleasant   Recent Labs: No results found for requested labs within last 8760 hours.    Lipid Panel Lab Results  Component Value Date   CHOL 155 06/03/2020   HDL 49 06/03/2020   LDLCALC 93 06/03/2020   TRIG 68 06/03/2020      Wt Readings from Last 3 Encounters:  06/04/21 151 lb (68.5 kg)  06/03/20 153 lb (69.4 kg)  05/28/19 163 lb (73.9 kg)      ASSESSMENT AND PLAN:  Coronary artery disease involving native coronary artery of native heart with angina pectoris with documented spasm (HCC) -  Currently with no symptoms of angina. No further workup at this time. Continue current medication regimen.  Hyperlipidemia, unspecified hyperlipidemia type - Plan: EKG 12-Lead Cholesterol is at goal on the current lipid regimen. No changes to the medications were made.  S/P coronary artery stent placement - Plan: EKG 12-Lead  stent with him placed May 2013 OM vessel Continue aspirin and statin No angina  Essential hypertension Blood pressure is well controlled on today's visit. No changes made to the medications.  Excessive sweating Feels it is from low sugar Better with pepsi, crackers Rare episodes   Total encounter time more than 25 minutes  Greater than 50% was spent in counseling and coordination of care with the patient   No orders of the defined types  were placed in this encounter.    Signed, Esmond Plants, M.D., Ph.D. 06/04/2021  [facility] [place], [place]

## 2021-06-04 NOTE — Patient Instructions (Signed)
Medication Instructions:  No changes  If you need a refill on your cardiac medications before your next appointment, please call your pharmacy.    Lab work: No new labs needed   If you have labs (blood work) drawn today and your tests are completely normal, you will receive your results only by: MyChart Message (if you have MyChart) OR A paper copy in the mail If you have any lab test that is abnormal or we need to change your treatment, we will call you to review the results.   Testing/Procedures: No new testing needed   Follow-Up: At [facility], you and your health needs are our priority.  As part of our continuing mission to provide you with exceptional heart care, we have created designated Provider Care Teams.  These Care Teams include your primary Cardiologist (physician) and Advanced Practice Providers (APPs -  Physician Assistants and Nurse Practitioners) who all work together to provide you with the care you need, when you need it.  You will need a follow up appointment in 12 months  Providers on your designated Care Team:   Christopher Berge, NP Ryan Dunn, PA-C Jacquelyn Visser, PA-C Cadence Furth, PA-C  Any Other Special Instructions Will Be Listed Below (If Applicable).  COVID-19 Vaccine Information can be found at: [email or web address] For questions related to vaccine distribution or appointments, please email [email or web address] or call [phone].    

## 2021-06-23 ENCOUNTER — Other Ambulatory Visit: Payer: Self-pay

## 2021-06-23 ENCOUNTER — Ambulatory Visit: Payer: Medicare Other | Admitting: Dermatology

## 2021-06-23 DIAGNOSIS — L570 Actinic keratosis: Secondary | ICD-10-CM

## 2021-06-23 DIAGNOSIS — Z85828 Personal history of other malignant neoplasm of skin: Secondary | ICD-10-CM | POA: Diagnosis not present

## 2021-06-23 DIAGNOSIS — L578 Other skin changes due to chronic exposure to nonionizing radiation: Secondary | ICD-10-CM

## 2021-06-23 DIAGNOSIS — L820 Inflamed seborrheic keratosis: Secondary | ICD-10-CM | POA: Diagnosis not present

## 2021-06-23 NOTE — Patient Instructions (Signed)

## 2021-06-23 NOTE — Progress Notes (Signed)
Follow-Up Visit   Subjective  Patrick Malone is a 87 y.o. male who presents for the following: Actinic Keratosis (S/P LN2 and PDT - check for new or persistent skin lesions today). Recheck BCC on the R nose today. Patient states site is occasionally irritated by his glasses.  The following portions of the chart were reviewed this encounter and updated as appropriate:   Tobacco  Allergies  Meds  Problems  Med Hx  Surg Hx  Fam Hx     Review of Systems:  No other skin or systemic complaints except as noted in HPI or Assessment and Plan.  Objective  Well appearing patient in no apparent distress; mood and affect are within normal limits.  A focused examination was performed including the face, scalp, arms, and hands. Relevant physical exam findings are noted in the Assessment and Plan.  Scalp/ears/face x 30 (30) Erythematous thin papules/macules with gritty scale.   Face x 2 (2) Erythematous keratotic or waxy stuck-on papule or plaque.   R mid dorsum forearm x 1 Erythematous thin papules/macules with gritty scale.    Assessment & Plan  AK (actinic keratosis) (30) Scalp/ears/face x 30  Destruction of lesion - Scalp/ears/face x 30 Complexity: simple   Destruction method: cryotherapy   Informed consent: discussed and consent obtained   Timeout:  patient name, date of birth, surgical site, and procedure verified Lesion destroyed using liquid nitrogen: Yes   Region frozen until ice ball extended beyond lesion: Yes   Outcome: patient tolerated procedure well with no complications   Post-procedure details: wound care instructions given    Inflamed seborrheic keratosis Face x 2  Destruction of lesion - Face x 2 Complexity: simple   Destruction method: cryotherapy   Informed consent: discussed and consent obtained   Timeout:  patient name, date of birth, surgical site, and procedure verified Lesion destroyed using liquid nitrogen: Yes   Region frozen until ice ball  extended beyond lesion: Yes   Outcome: patient tolerated procedure well with no complications   Post-procedure details: wound care instructions given    Hypertrophic actinic keratosis R mid dorsum forearm x 1  If not resolved at follow up consider biopsy.   Actinic Damage - Severe, confluent actinic changes with pre-cancerous actinic keratoses  - Severe, chronic, not at goal, secondary to cumulative UV radiation exposure over time - diffuse scaly erythematous macules and papules with underlying dyspigmentation - Discussed Prescription "Field Treatment" for Severe, Chronic Confluent Actinic Changes with Pre-Cancerous Actinic Keratoses Field treatment involves treatment of an entire area of skin that has confluent Actinic Changes (Sun/ Ultraviolet light damage) and PreCancerous Actinic Keratoses by method of PhotoDynamic Therapy (PDT) and/or prescription Topical Chemotherapy agents such as 5-fluorouracil, 5-fluorouracil/calcipotriene, and/or imiquimod.  The purpose is to decrease the number of clinically evident and subclinical PreCancerous lesions to prevent progression to development of skin cancer by chemically destroying early precancer changes that may or may not be visible.  It has been shown to reduce the risk of developing skin cancer in the treated area. As a result of treatment, redness, scaling, crusting, and open sores may occur during treatment course. One or more than one of these methods may be used and may have to be used several times to control, suppress and eliminate the PreCancerous changes. Discussed treatment course, expected reaction, and possible side effects. - Recommend daily broad spectrum sunscreen SPF 30+ to sun-exposed areas, reapply every 2 hours as needed.  - Staying in the shade or wearing long  sleeves, sun glasses (UVA+UVB protection) and wide brim hats (4-inch brim around the entire circumference of the hat) are also recommended. - Call for new or changing lesions.    History of Basal Cell Carcinoma of the Skin - No evidence of recurrence today - Recommend regular full body skin exams - Recommend daily broad spectrum sunscreen SPF 30+ to sun-exposed areas, reapply every 2 hours as needed.  - Call if any new or changing lesions are noted between office visits  Return in about 2 months (around 08/23/2021) for AK follow up .  Luther Redo, CMA, am acting as scribe for [name], MD . Documentation: I have reviewed the above documentation for accuracy and completeness, and I agree with the above.  [name], MD

## 2021-06-25 ENCOUNTER — Encounter: Payer: Self-pay | Admitting: Dermatology

## 2021-07-25 ENCOUNTER — Other Ambulatory Visit: Payer: Self-pay | Admitting: Cardiovascular Disease

## 2021-07-26 IMAGING — RF DG ESOPHAGUS
7 series · 14 of 16 positions shown · non-contrast
Comparison: None.

CLINICAL DATA: Dysphagia since remote cervical spine surgery
performed in 5112, now with worsening cough the past 2 years.

EXAM:
ESOPHOGRAM/BARIUM SWALLOW
TECHNIQUE: Combined double contrast and single contrast examination performed
using effervescent crystals, thick barium liquid, and thin barium
liquid.
FLUOROSCOPY TIME:  1 minutes, 18 seconds

[Series 1: cp_standard · 0.25mm/px · 1 of 1 slices shown (1 of 7)]
[im 1/1]
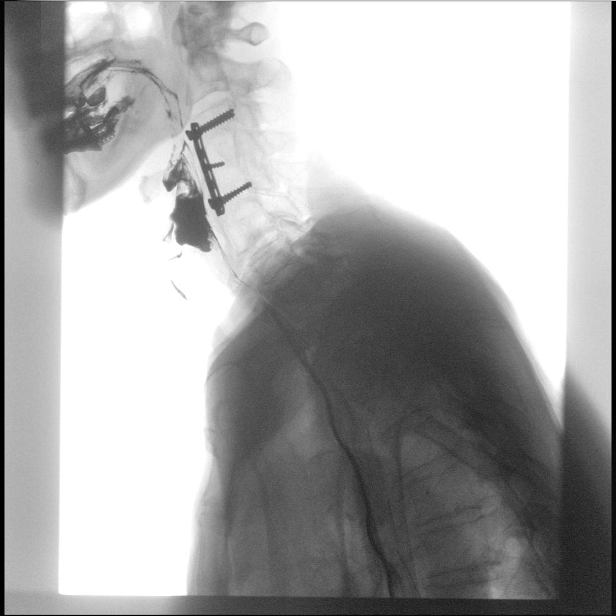

[Series 2: cp_standard · 0.25mm/px · 1 of 1 slices shown (2 of 7)]
[im 1/1]
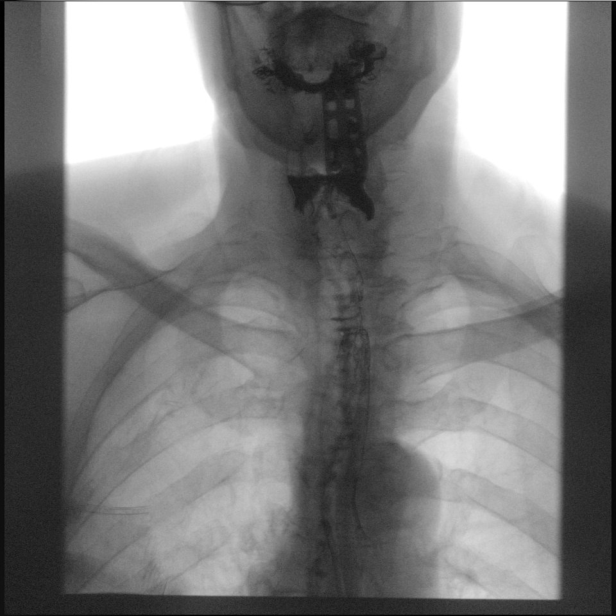

[Series 3: cp_standard · 0.25mm/px · 1 of 1 slices shown (3 of 7)]
[im 1/1]
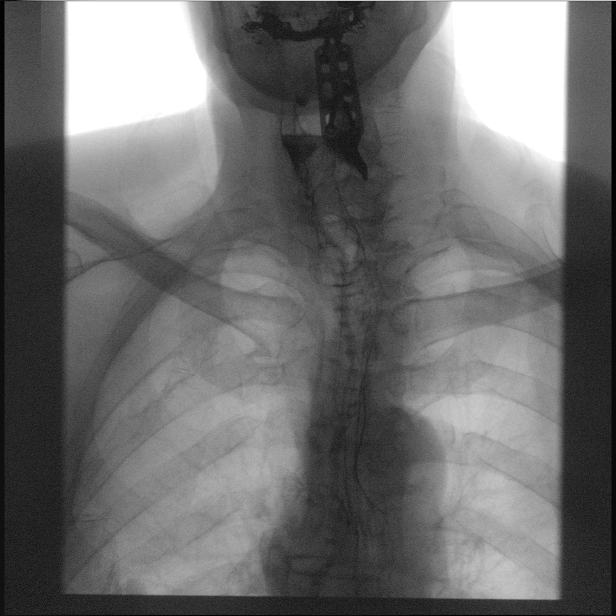

[Series 4: cp_standard · 0.25mm/px · 3 of 115 frames shown (4 of 7)]
[frame 58/115]
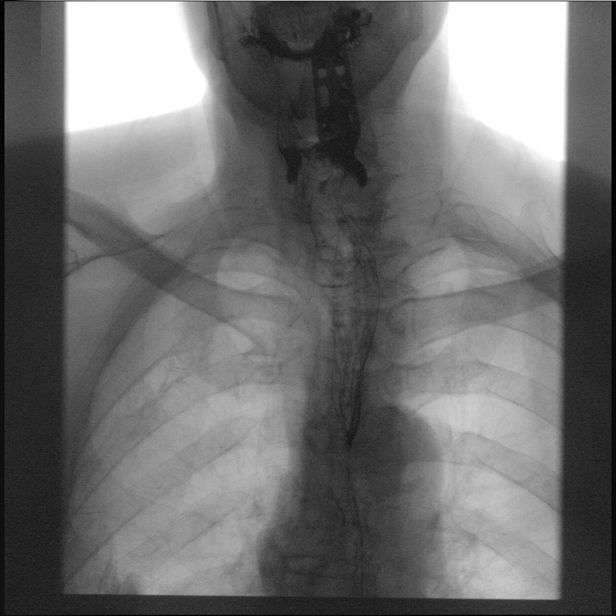
[frame 96/115]
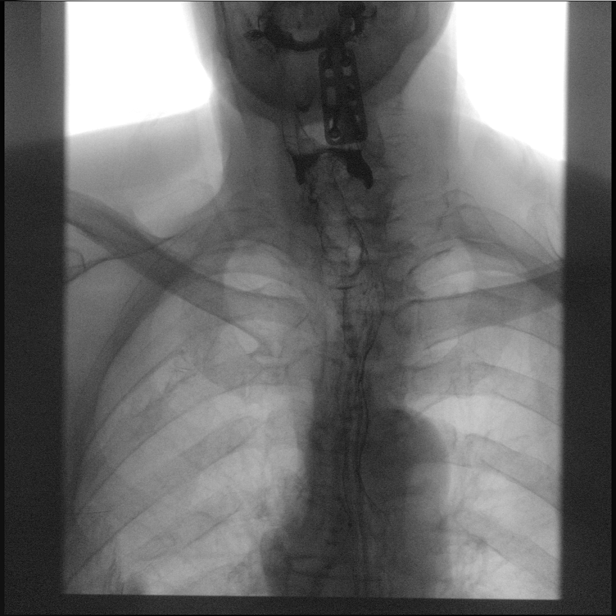
[frame 98/115]
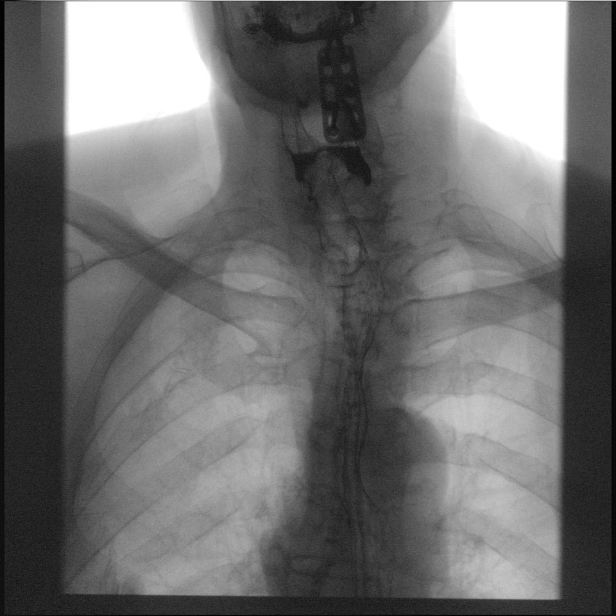

[Series 5: cp_standard · 0.25mm/px · 4 of 137 frames shown (5 of 7)]
[frame 21/137]
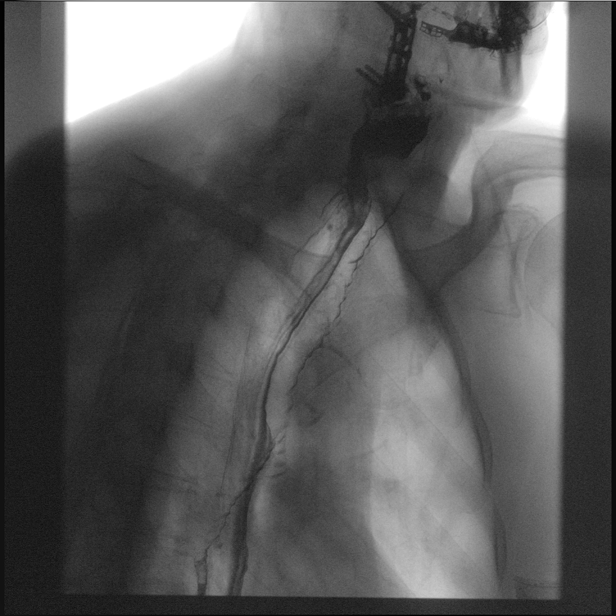
[frame 36/137]
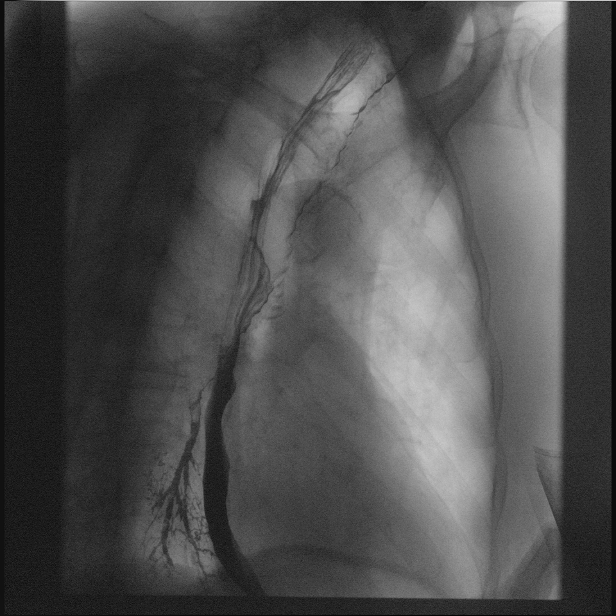
[frame 69/137]
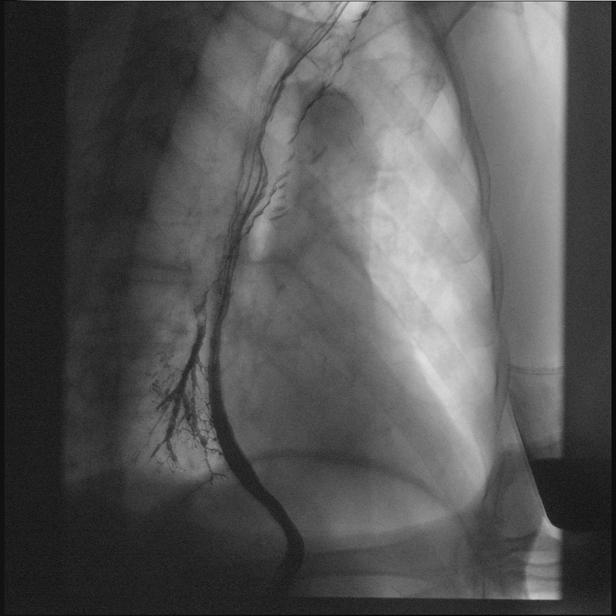
[frame 117/137]
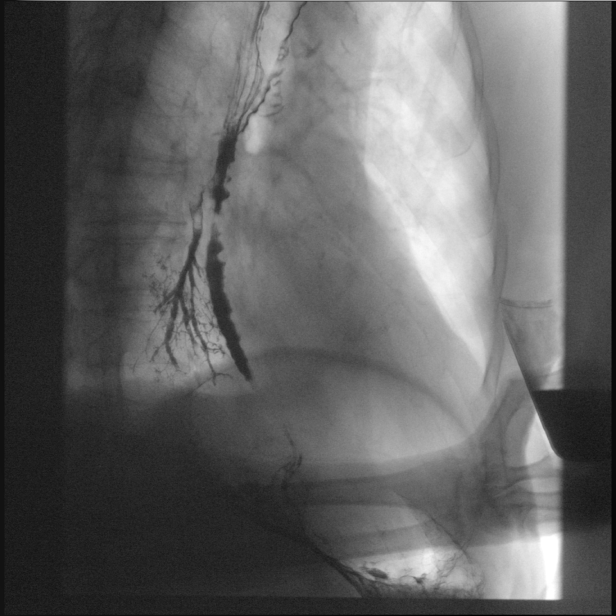

[Series 6: cp_standard · 0.25mm/px · 3 of 88 frames shown (6 of 7)]
[frame 42/88]
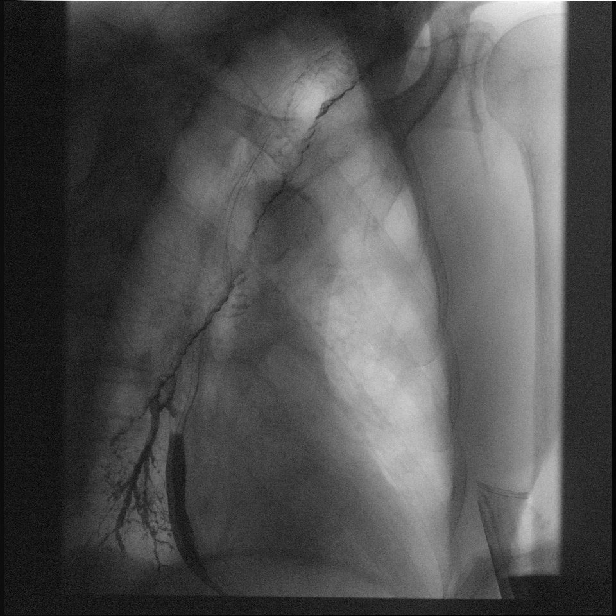
[frame 45/88]
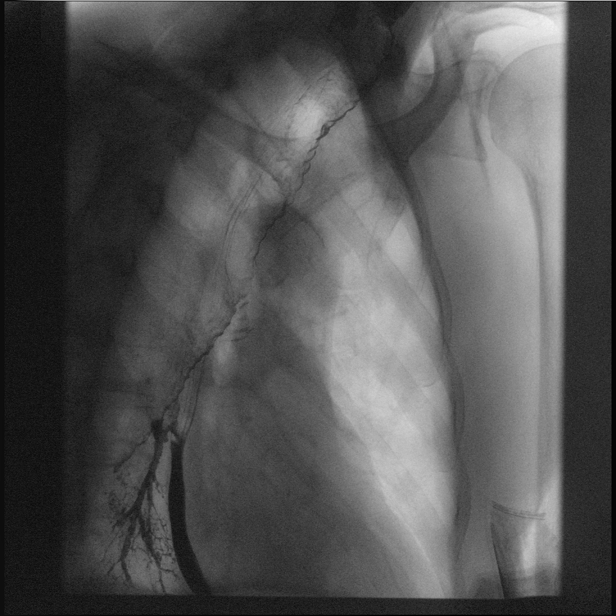
[frame 75/88]
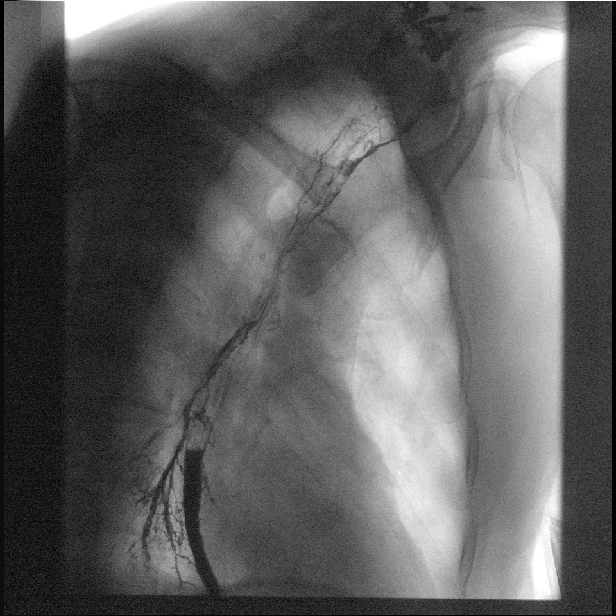

[Series 7: cp_standard · 0.27mm/px · 1 of 1 slices shown (7 of 7)]
[im 1/1]
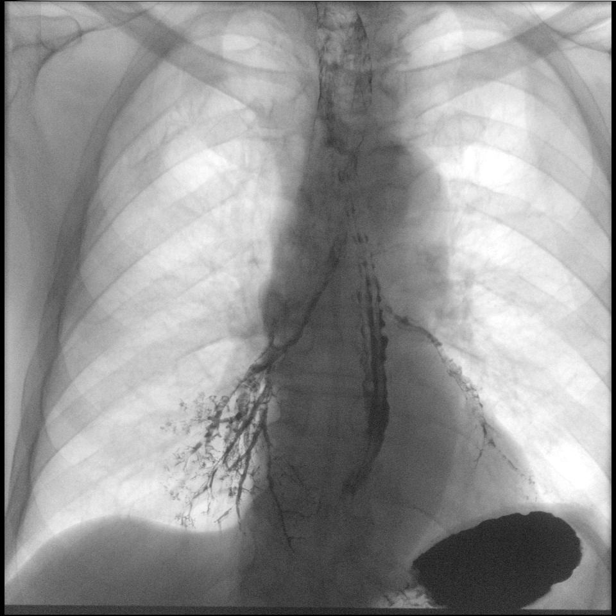

[14 of 16 positions shown; findings below may reference images not displayed]

FINDINGS: Fluoroscopic evaluation of the pharynx during rapid sequence imaging
with swallows of barium demonstrate obvious aspiration barium into
the trachea, right and left mainstem bronchi.

Note is made of transit of contrast through the esophagus to the
superior aspect the stomach, however given the frank aspiration, the
examination was terminated.
IMPRESSION: Frank aspiration barium into the trachea, right and left mainstem
bronchi.

Formal speech pathology evaluation is advised.

These results will be called to the ordering clinician or
representative by the Radiologist Assistant, and communication
documented in the PACS or [REDACTED].

## 2021-09-02 ENCOUNTER — Other Ambulatory Visit: Payer: Self-pay

## 2021-09-02 ENCOUNTER — Ambulatory Visit: Payer: Medicare Other | Admitting: Dermatology

## 2021-09-02 DIAGNOSIS — L578 Other skin changes due to chronic exposure to nonionizing radiation: Secondary | ICD-10-CM | POA: Diagnosis not present

## 2021-09-02 DIAGNOSIS — L720 Epidermal cyst: Secondary | ICD-10-CM | POA: Diagnosis not present

## 2021-09-02 DIAGNOSIS — L570 Actinic keratosis: Secondary | ICD-10-CM

## 2021-09-02 DIAGNOSIS — Z872 Personal history of diseases of the skin and subcutaneous tissue: Secondary | ICD-10-CM

## 2021-09-02 DIAGNOSIS — L820 Inflamed seborrheic keratosis: Secondary | ICD-10-CM

## 2021-09-02 MED ORDER — FLUOROURACIL 5 % EX CREA: TOPICAL_CREAM | Freq: Two times a day (BID) | CUTANEOUS | 1 refills | Status: DC

## 2021-09-02 NOTE — Patient Instructions (Signed)
Start in January apply as instructed below  [facility] 5-fluorouracil/calcipotriene cream twice a day for 7 days to affected areas including forehead and scalp. Prescription sent to [facility]. Patient provided with contact information for pharmacy and advised the pharmacy will mail the prescription to their home. Patient provided with handout reviewing treatment course and side effects and advised to call or message [place] on MyChart with any concerns.   5-Fluorouracil/Calcipotriene Patient Education   Actinic keratoses are the dry, red scaly spots on the skin caused by sun damage. A portion of these spots can turn into skin cancer with time, and treating them can help prevent development of skin cancer.   Treatment of these spots requires removal of the defective skin cells. There are various ways to remove actinic keratoses, including freezing with liquid nitrogen, treatment with creams, or treatment with a blue light procedure in the office.   5-fluorouracil cream is a topical cream used to treat actinic keratoses. It works by interfering with the growth of abnormal fast-growing skin cells, such as actinic keratoses. These cells peel off and are replaced by healthy ones.   5-fluorouracil/calcipotriene is a combination of the 5-fluorouracil cream with a vitamin D analog cream called calcipotriene. The calcipotriene alone does not treat actinic keratoses. However, when it is combined with 5-fluorouracil, it helps the 5-fluorouracil treat the actinic keratoses much faster so that the same results can be achieved with a much shorter treatment time.  INSTRUCTIONS FOR 5-FLUOROURACIL/CALCIPOTRIENE CREAM:   5-fluorouracil/calcipotriene cream typically only needs to be used for 4-7 days. A thin layer should be applied twice a day to the treatment areas recommended by your physician.   If your physician prescribed you separate tubes of 5-fluourouracil and calcipotriene, apply a thin layer of  5-fluorouracil followed by a thin layer of calcipotriene.   Avoid contact with your eyes, nostrils, and mouth. Do not use 5-fluorouracil/calcipotriene cream on infected or open wounds.   You will develop redness, irritation and some crusting at areas where you have pre-cancer damage/actinic keratoses. IF YOU DEVELOP PAIN, BLEEDING, OR SIGNIFICANT CRUSTING, STOP THE TREATMENT EARLY - you have already gotten a good response and the actinic keratoses should clear up well.  Wash your hands after applying 5-fluorouracil 5% cream on your skin.   A moisturizer or sunscreen with a minimum SPF 30 should be applied each morning.   Once you have finished the treatment, you can apply a thin layer of Vaseline twice a day to irritated areas to soothe and calm the areas more quickly. If you experience significant discomfort, contact your physician.  For some patients it is necessary to repeat the treatment for best results.  SIDE EFFECTS: When using 5-fluorouracil/calcipotriene cream, you may have mild irritation, such as redness, dryness, swelling, or a mild burning sensation. This usually resolves within 2 weeks. The more actinic keratoses you have, the more redness and inflammation you can expect during treatment. Eye irritation has been reported rarely. If this occurs, please let us know.  If you have any trouble using this cream, please call the office. If you have any other questions about this information, please do not hesitate to ask me before you leave the office.         Actinic keratoses are precancerous spots that appear secondary to cumulative UV radiation exposure/sun exposure over time. They are chronic with expected duration over 1 year. A portion of actinic keratoses will progress to squamous cell carcinoma of the skin. It is not possible to  reliably predict which spots will progress to skin cancer and so treatment is recommended to prevent development of skin cancer.  Recommend daily  broad spectrum sunscreen SPF 30+ to sun-exposed areas, reapply every 2 hours as needed.  Recommend staying in the shade or wearing long sleeves, sun glasses (UVA+UVB protection) and wide brim hats (4-inch brim around the entire circumference of the hat). Call for new or changing lesions.   Cryotherapy Aftercare  Wash gently with soap and water everyday.   Apply Vaseline and Band-Aid daily until healed.   Seborrheic Keratosis  What causes seborrheic keratoses? Seborrheic keratoses are harmless, common skin growths that first appear during adult life.  As time goes by, more growths appear.  Some people may develop a large number of them.  Seborrheic keratoses appear on both covered and uncovered body parts.  They are not caused by sunlight.  The tendency to develop seborrheic keratoses can be inherited.  They vary in color from skin-colored to gray, brown, or even black.  They can be either smooth or have a rough, warty surface.   Seborrheic keratoses are superficial and look as if they were stuck on the skin.  Under the microscope this type of keratosis looks like layers upon layers of skin.  That is why at times the top layer may seem to fall off, but the rest of the growth remains and re-grows.    Treatment Seborrheic keratoses do not need to be treated, but can easily be removed in the office.  Seborrheic keratoses often cause symptoms when they rub on clothing or jewelry.  Lesions can be in the way of shaving.  If they become inflamed, they can cause itching, soreness, or burning.  Removal of a seborrheic keratosis can be accomplished by freezing, burning, or surgery. If any spot bleeds, scabs, or grows rapidly, please return to have it checked, as these can be an indication of a skin cancer.  If You Need Anything After Your Visit  If you have any questions or concerns for your doctor, please call our main line at [phone] and press option 4 to reach your doctor's medical assistant. If no  one answers, please leave a voicemail as directed and we will return your call as soon as possible. Messages left after 4 pm will be answered the following business day.   You may also send [place] a message via [place]. We typically respond to MyChart messages within 1-2 business days.  For prescription refills, please ask your pharmacy to contact our office. Our fax number is [phone].  If you have an urgent issue when the clinic is closed that cannot wait until the next business day, you can page your doctor at the number below.    Please note that while we do our best to be available for urgent issues outside of office hours, we are not available 24/7.   If you have an urgent issue and are unable to reach [place], you may choose to seek medical care at your doctor's office, retail clinic, urgent care center, or emergency room.  If you have a medical emergency, please immediately call 911 or go to the emergency department.  Pager Numbers  - Dr. Nehemiah Massed: [phone]  - Dr. Laurence Ferrari: [phone]  - Dr. Nicole Kindred: [phone]  In the event of inclement weather, please call our main line at [phone] for an update on the status of any delays or closures.  Dermatology Medication Tips: Please keep the boxes that topical medications come in in  order to help keep track of the instructions about where and how to use these. Pharmacies typically print the medication instructions only on the boxes and not directly on the medication tubes.   If your medication is too expensive, please contact our office at [phone] option 4 or send [place] a message through [place].   We are unable to tell what your co-pay for medications will be in advance as this is different depending on your insurance coverage. However, we may be able to find a substitute medication at lower cost or fill out paperwork to get insurance to cover a needed medication.   If a prior authorization is required to get your medication  covered by your insurance company, please allow [place] 1-2 business days to complete this process.  Drug prices often vary depending on where the prescription is filled and some pharmacies may offer cheaper prices.  The website www.goodrx.com contains coupons for medications through different pharmacies. The prices here do not account for what the cost may be with help from insurance (it may be cheaper with your insurance), but the website can give you the price if you did not use any insurance.  - You can print the associated coupon and take it with your prescription to the pharmacy.  - You may also stop by our office during regular business hours and pick up a GoodRx coupon card.  - If you need your prescription sent electronically to a different pharmacy, notify our office through [facility] or by phone at [phone] option 4.     Si Usted Necesita Algo Despus de Su Visita  Tambin puede enviarnos un mensaje a travs de Pharmacist, community. Por lo general respondemos a los mensajes de MyChart en el transcurso de 1 a 2 das hbiles.  Para renovar recetas, por favor pida a su farmacia que se ponga en contacto con nuestra oficina. Harland Dingwall de fax es [place] [phone].  Si tiene un asunto urgente cuando la clnica est cerrada y que no puede esperar hasta el siguiente da hbil, puede llamar/localizar a su doctor(a) al nmero que aparece a continuacin.   Por favor, tenga en cuenta que aunque hacemos todo lo posible para estar disponibles para asuntos urgentes fuera del horario de [place], no estamos disponibles las 24 horas del da, los 7 das de la [place].   Si tiene un problema urgente y no puede comunicarse con nosotros, puede optar por buscar atencin mdica  en el consultorio de su doctor(a), en una clnica privada, en un centro de atencin urgente o en una sala de emergencias.  Si tiene Engineering geologist, por favor llame inmediatamente al 911 o vaya a la sala de  emergencias.  Nmeros de bper  - Dr. Nehemiah Massed: [phone]  - Dra. Moye: [phone]  - Dra. Nicole Kindred: [phone]  En caso de inclemencias del [place], por favor llame a Johnsie Kindred principal al [phone] para una actualizacin sobre el [place] de cualquier retraso o cierre.  Consejos para la medicacin en dermatologa: Por favor, guarde las cajas en las que vienen los medicamentos de uso tpico para ayudarle a seguir las instrucciones sobre dnde y cmo usarlos. Las farmacias generalmente imprimen las instrucciones del medicamento slo en las cajas y no directamente en los tubos del [place].   Si su medicamento es muy caro, por favor, pngase en contacto con Zigmund Daniel llamando al [phone] y presione la opcin 4 o envenos un mensaje a travs de Pharmacist, community.   No podemos decirle cul ser su  copago por los medicamentos por adelantado ya que esto es diferente dependiendo de la cobertura de su seguro. Sin embargo, es posible que podamos encontrar un medicamento sustituto a Electrical engineer un formulario para que el seguro cubra el medicamento que se considera necesario.   Si se requiere una autorizacin previa para que su compaa de seguros [place] su medicamento, por favor permtanos de 1 a 2 das hbiles para completar este proceso.  Los precios de los medicamentos varan con frecuencia dependiendo del Environmental consultant de dnde se surte la receta y alguna farmacias pueden ofrecer precios ms baratos.  El sitio web www.goodrx.com tiene cupones para medicamentos de Airline pilot. Los precios aqu no tienen en cuenta lo que podra costar con la ayuda del seguro (puede ser ms barato con su seguro), pero el sitio web puede darle el precio si no utiliz Research scientist (physical sciences).  - Puede imprimir el cupn correspondiente y llevarlo con su receta a la farmacia.  - Tambin puede pasar por nuestra oficina durante el horario de atencin regular y Charity fundraiser una tarjeta de cupones de GoodRx.  - Si  necesita que su receta se enve electrnicamente a una farmacia diferente, informe a nuestra oficina a travs de MyChart de [place] o por telfono llamando al [phone] y presione la opcin 4.

## 2021-09-02 NOTE — Progress Notes (Signed)
Follow-Up Visit   Subjective  Patrick Malone is a 87 y.o. male who presents for the following: Follow-up (Patient here today for ak follow up. Patient has history of aks at face and arms. Rechecking a place on right mid dorsum forearm. ). Several spots and lesions to be evaluated. Concerned about skin cancer and has history of multiple skin cancers.   The following portions of the chart were reviewed this encounter and updated as appropriate:  Tobacco  Allergies  Meds  Problems  Med Hx  Surg Hx  Fam Hx     Review of Systems: No other skin or systemic complaints except as noted in HPI or Assessment and Plan.   Objective  Well appearing patient in no apparent distress; mood and affect are within normal limits.  A focused examination was performed including face; scalp arms; hands. Relevant physical exam findings are noted in the Assessment and Plan.  scalp, face, ears, arms and hands x 22 (22) Erythematous thin papules/macules with gritty scale.   scalp, face, ears, arms, hands x 29 (29) Erythematous keratotic or waxy stuck-on papule or plaque.   left nasal bridge 0.4 cm Subcutaneous nodule.   Assessment & Plan  Actinic keratosis (22) scalp, face, ears, arms and hands x 22 Actinic keratoses are precancerous spots that appear secondary to cumulative UV radiation exposure/sun exposure over time. They are chronic with expected duration over 1 year. A portion of actinic keratoses will progress to squamous cell carcinoma of the skin. It is not possible to reliably predict which spots will progress to skin cancer and so treatment is recommended to prevent development of skin cancer.  Recommend daily broad spectrum sunscreen SPF 30+ to sun-exposed areas, reapply every 2 hours as needed.  Recommend staying in the shade or wearing long sleeves, sun glasses (UVA+UVB protection) and wide brim hats (4-inch brim around the entire circumference of the hat). Call for new or changing  lesions.  Destruction of lesion - scalp, face, ears, arms and hands x 22 Complexity: simple   Destruction method: cryotherapy   Informed consent: discussed and consent obtained   Timeout:  patient name, date of birth, surgical site, and procedure verified Lesion destroyed using liquid nitrogen: Yes   Region frozen until ice ball extended beyond lesion: Yes   Outcome: patient tolerated procedure well with no complications   Post-procedure details: wound care instructions given   Additional details:  Prior to procedure, discussed risks of blister formation, small wound, skin dyspigmentation, or rare scar following cryotherapy. Recommend Vaseline ointment to treated areas while healing. fluorouracil (EFUDEX) 5 % cream - scalp, face, ears, arms and hands x 22 Apply topically 2 (two) times daily. Apply for 7 days to scalp and forehead.  Inflamed seborrheic keratosis scalp, face, ears, arms, hands x 29 Destruction of lesion - scalp, face, ears, arms, hands x 29 Complexity: simple   Destruction method: cryotherapy   Informed consent: discussed and consent obtained   Timeout:  patient name, date of birth, surgical site, and procedure verified Lesion destroyed using liquid nitrogen: Yes   Region frozen until ice ball extended beyond lesion: Yes   Outcome: patient tolerated procedure well with no complications   Post-procedure details: wound care instructions given   Additional details:  Prior to procedure, discussed risks of blister formation, small wound, skin dyspigmentation, or rare scar following cryotherapy. Recommend Vaseline ointment to treated areas while healing.  Epidermal inclusion cyst left nasal bridge Benign-appearing. Exam most consistent with an epidermal inclusion  cyst. Discussed that a cyst is a benign growth that can grow over time and sometimes get irritated or inflamed. Recommend observation if it is not bothersome. Discussed option of surgical excision to remove it if it is  growing, symptomatic, or other changes noted. Please call for new or changing lesions so they can be evaluated.  Actinic Damage - Severe, confluent actinic changes with pre-cancerous actinic keratoses  - Severe, chronic, not at goal, secondary to cumulative UV radiation exposure over time - diffuse scaly erythematous macules and papules with underlying dyspigmentation - Discussed Prescription "Field Treatment" for Severe, Chronic Confluent Actinic Changes with Pre-Cancerous Actinic Keratoses Field treatment involves treatment of an entire area of skin that has confluent Actinic Changes (Sun/ Ultraviolet light damage) and PreCancerous Actinic Keratoses by method of PhotoDynamic Therapy (PDT) and/or prescription Topical Chemotherapy agents such as 5-fluorouracil, 5-fluorouracil/calcipotriene, and/or imiquimod.  The purpose is to decrease the number of clinically evident and subclinical PreCancerous lesions to prevent progression to development of skin cancer by chemically destroying early precancer changes that may or may not be visible.  It has been shown to reduce the risk of developing skin cancer in the treated area. As a result of treatment, redness, scaling, crusting, and open sores may occur during treatment course. One or more than one of these methods may be used and may have to be used several times to control, suppress and eliminate the PreCancerous changes. Discussed treatment course, expected reaction, and possible side effects. - Recommend daily broad spectrum sunscreen SPF 30+ to sun-exposed areas, reapply every 2 hours as needed.  - Staying in the shade or wearing long sleeves, sun glasses (UVA+UVB protection) and wide brim hats (4-inch brim around the entire circumference of the hat) are also recommended. - Call for new or changing lesions. Start in January  [facility] 5-fluorouracil/calcipotriene cream twice a day for 7 days to affected areas including forehead and scalp. Prescription sent to  [facility]. Patient advised they will receive an email to purchase the medication online and have it sent to their home. Patient provided with handout reviewing treatment course and side effects and advised to call or message [place] on MyChart with any concerns.  History of PreCancerous Actinic Keratosis  - site(s) of PreCancerous Actinic Keratosis clear today. - these may recur and new lesions may form requiring treatment to prevent transformation into skin cancer - observe for new or changing spots and contact [place] for appointment if occur - photoprotection with sun protective clothing; sunglasses and broad spectrum sunscreen with SPF of at least 30 + and frequent self skin exams recommended - yearly exams by a dermatologist recommended for persons with history of PreCancerous Actinic Keratoses  Return for ak follow up in march . IRuthell Rummage, CMA, am acting as scribe for [name], MD.  Documentation: I have reviewed the above documentation for accuracy and completeness, and I agree with the above.  [name], MD

## 2021-09-14 ENCOUNTER — Encounter: Payer: Self-pay | Admitting: Dermatology

## 2021-12-21 ENCOUNTER — Ambulatory Visit: Payer: Medicare Other | Admitting: Dermatology

## 2021-12-21 ENCOUNTER — Other Ambulatory Visit: Payer: Self-pay

## 2021-12-21 DIAGNOSIS — L578 Other skin changes due to chronic exposure to nonionizing radiation: Secondary | ICD-10-CM

## 2021-12-21 DIAGNOSIS — L570 Actinic keratosis: Secondary | ICD-10-CM | POA: Diagnosis not present

## 2021-12-21 DIAGNOSIS — L821 Other seborrheic keratosis: Secondary | ICD-10-CM | POA: Diagnosis not present

## 2021-12-21 NOTE — Patient Instructions (Signed)

## 2021-12-21 NOTE — Progress Notes (Signed)
? ?  Follow-Up Visit ?  ?Subjective  ?Patrick Malone is a 88 y.o. male who presents for the following: Actinic Keratosis (Of the face, scalp, arms, hands - S/P LN2 and 5FU/Calcipotriene mix BID x 7 days to the forehead and scalp. Patient is here today to check for any new or persistent precancerous skin lesions.). ?The patient has spots, moles and lesions to be evaluated, some may be new or changing and the patient has concerns that these could be cancer. ? ?The following portions of the chart were reviewed this encounter and updated as appropriate:  ? Tobacco  Allergies  Meds  Problems  Med Hx  Surg Hx  Fam Hx   ?  ?Review of Systems:  No other skin or systemic complaints except as noted in HPI or Assessment and Plan. ? ?Objective  ?Well appearing patient in no apparent distress; mood and affect are within normal limits. ? ?A focused examination was performed including the face, ears, and scalp. Relevant physical exam findings are noted in the Assessment and Plan. ? ?Scalp and sup forehead x 16 (16) ?Erythematous thin papules/macules with gritty scale.  ? ? ?Assessment & Plan  ?AK (actinic keratosis) (16) ?Scalp and sup forehead x 16 ? ?In one month restart the 5FU/Calcipotriene cream mix BID x 7 days to the forehead and scalp. ? ?Recommend a six month follow up appointment but patient declines a follow up at this time and will RTC PRN lesions.  ? ?Destruction of lesion - Scalp and sup forehead x 16 ?Complexity: simple   ?Destruction method: cryotherapy   ?Informed consent: discussed and consent obtained   ?Timeout:  patient name, date of birth, surgical site, and procedure verified ?Lesion destroyed using liquid nitrogen: Yes   ?Region frozen until ice ball extended beyond lesion: Yes   ?Outcome: patient tolerated procedure well with no complications   ?Post-procedure details: wound care instructions given   ? ?Actinic Damage ?- chronic, secondary to cumulative UV radiation exposure/sun exposure over  time ?- diffuse scaly erythematous macules with underlying dyspigmentation ?- Recommend daily broad spectrum sunscreen SPF 30+ to sun-exposed areas, reapply every 2 hours as needed.  ?- Recommend staying in the shade or wearing long sleeves, sun glasses (UVA+UVB protection) and wide brim hats (4-inch brim around the entire circumference of the hat). ?- Call for new or changing lesions. ? ?Seborrheic Keratoses ?- Stuck-on, waxy, tan-brown papules and/or plaques  ?- Benign-appearing ?- Discussed benign etiology and prognosis. ?- Observe ?- Call for any changes ? ?Return if symptoms worsen or fail to improve, for a 6 mth f/u appt but patient declines a follow up at this time and will RTC PRN lesions. . ? ?IRudell Cobb, CMA, am acting as scribe for [name], MD . ?Documentation: I have reviewed the above documentation for accuracy and completeness, and I agree with the above. ? ?[name], MD ? ? ?

## 2021-12-26 ENCOUNTER — Encounter: Payer: Self-pay | Admitting: Dermatology

## 2022-02-03 ENCOUNTER — Other Ambulatory Visit: Payer: Self-pay | Admitting: Cardiovascular Disease

## 2022-05-10 ENCOUNTER — Other Ambulatory Visit: Payer: Self-pay | Admitting: Cardiovascular Disease

## 2022-06-06 NOTE — Progress Notes (Signed)
Cardiology Office Note  Date:  06/07/2022   ID:  JERRAD MENDIBLES, DOB 09/28/1932, MRN [id number]  PCP:  Juluis Pitch, MD   Chief Complaint  Patient presents with   12 month follow up     Patient c/o heaviness in feet. Medications reviewed by the patient verbally.     HPI:  Mr. Crehan  is a very pleasant 88 year old gentleman with a history of  coronary artery disease,   Cardiac catheterization May 2013 with Resolute  2.5 x 18 mm stent placed to his OM1 (99% to 0%),   Hyperlipidemia,  prior  Cervical surgery,   Periodic episodes of weakness, sweating  Who presents for routine follow-up of his coronary artery disease  LOV September 2022 Recently seen by primary care May 10, 2022, he was encouraged to use a walker instead of a cane for better support Chronic dysphagia  Reports that he continues to drive, uses a cane, wife does not drive She does the grocery shopping, uses her walker Today he had trouble getting into office, long walk, valet parking was not available  Weight stable, low 150s Sedentary, leg weak No recent fall He is inclined not to use a walker until he has to  denies any chest pain concerning for angina. No regular exercise program, limited by arthralgias  Lab work reviewed Hemoglobin 13.3 Normal LFTs Creatinine 0.9 BUN 25 Total cholesterol 145 LDL 79  EKG personally reviewed by myself on todays visit Shows  normal sinus rhythm with rate 82 bpm, No significant ST or T-wave changes   Other past medical history reviewed Previous periodic episodes of weakness, sweating that can presents at any time though rare.  Episodes might be once every several months  He does report significant workup in the past including numerous Holter monitors demonstrating tachycardia episodes,  Prior stress testing , all of which were unrevealing per the patient.  He has never had a monitor in place when he had an episode.   PMH:   has a past medical history of Basal cell  carcinoma (02/24/2021), Clotting disorder ([place]), Coronary artery disease, ED (erectile dysfunction), Glaucoma, Hyperlipidemia, Hypertension, Hypotestosteronism, MI (myocardial infarction) ([place]), Osteoarthritis, Raynaud's disease, Skin cancer (years ago), SVT (supraventricular tachycardia) ([place]), and Syncope and collapse.  PSH:    Past Surgical History:  Procedure Laterality Date   CARDIAC CATHETERIZATION     CERVICAL SPINE SURGERY     CORONARY ANGIOPLASTY WITH STENT PLACEMENT  02/29/2012   [facility] placed the stent 2.5 mm x 18 mm Ref # [id number] LOT # [id number]   lung cyst removed      TONSILLECTOMY     TRACHEAL SURGERY      Current Outpatient Medications  Medication Sig Dispense Refill   aspirin 81 MG chewable tablet Chew 81 mg by mouth daily.      atorvastatin (LIPITOR) 40 MG tablet Take 1 tablet (40 mg total) by mouth daily. 90 tablet 3   fluorouracil (EFUDEX) 5 % cream Apply topically 2 (two) times daily. Apply for 7 days to scalp and forehead. 15 g 1   lisinopril (ZESTRIL) 10 MG tablet TAKE 1 TABLET BY MOUTH DAILY 90 tablet 0   Pediatric Multiple Vit-C-FA (CHEWABLE VITE CHILDRENS) CHEW Chew by mouth.     timolol (TIMOPTIC) 0.5 % ophthalmic solution Place 1 drop into both eyes daily.      No current facility-administered medications for this visit.    Allergies:   Cefazolin and Clindamycin   Social History:  The patient  reports that he has quit smoking. His smoking use included cigarettes. He has a 10.00 pack-year smoking history. He has never used smokeless tobacco. He reports that he does not drink alcohol and does not use drugs.   Family History:   family history includes Heart attack (age of onset: 23) in his mother.    Review of Systems: Review of Systems  Constitutional: Negative.   HENT: Negative.    Respiratory: Negative.    Cardiovascular: Negative.   Gastrointestinal: Negative.        Dysphagia  Musculoskeletal:        Poor balance, weakness  Neurological:  Negative.   Psychiatric/Behavioral: Negative.    All other systems reviewed and are negative.  PHYSICAL EXAM: VS:  BP 104/60 (BP Location: Left Arm, Patient Position: Sitting, Cuff Size: Normal)   Pulse 82   Ht 6' (1.829 m)   Wt 153 lb 2 oz (69.5 kg)   BMI 20.77 kg/m  , BMI Body mass index is 20.77 kg/m. Constitutional:  oriented to person, place, and time. No distress. thin HENT:  Head: Grossly normal Eyes:  no discharge. No scleral icterus.  Neck: No JVD, no carotid bruits  Cardiovascular: Regular rate and rhythm, no murmurs appreciated Pulmonary/Chest: Clear to auscultation bilaterally, no wheezes or rails Abdominal: Soft.  no distension.  no tenderness.  Musculoskeletal: Normal range of motion Neurological:  normal muscle tone. Coordination normal. No atrophy Skin: Skin warm and dry Psychiatric: normal affect, pleasant  Recent Labs: No results found for requested labs within last 365 days.    Lipid Panel Lab Results  Component Value Date   CHOL 155 06/03/2020   HDL 49 06/03/2020   LDLCALC 93 06/03/2020   TRIG 68 06/03/2020      Wt Readings from Last 3 Encounters:  06/07/22 153 lb 2 oz (69.5 kg)  06/04/21 151 lb (68.5 kg)  06/03/20 153 lb (69.4 kg)     ASSESSMENT AND PLAN:  Coronary artery disease involving native coronary artery of native heart with angina pectoris with documented spasm (HCC) -  Currently with no symptoms of angina. No further workup at this time. Continue current medication regimen.  Hyperlipidemia, unspecified hyperlipidemia type - Plan: EKG 12-Lead Cholesterol is at goal on the current lipid regimen. No changes to the medications were made.  S/P coronary artery stent placement - Plan: EKG 12-Lead  stent with him placed May 2013 OM vessel Continue aspirin and statin No angina  Essential hypertension Blood pressure is well controlled on today's visit. No changes made to the medications.  Dysphagia Previously seen by Dr. Tiffany Kocher years  ago Trouble swallowing large pills, sometimes regurgitates them and swallows them again    Total encounter time more than 30 minutes  Greater than 50% was spent in counseling and coordination of care with the patient   Orders Placed This Encounter  Procedures   EKG 12-Lead     Signed, Esmond Plants, M.D., Ph.D. 06/07/2022  [place], [place]

## 2022-06-07 ENCOUNTER — Encounter: Payer: Self-pay | Admitting: Cardiovascular Disease

## 2022-06-07 ENCOUNTER — Ambulatory Visit: Payer: Medicare Other | Attending: Cardiovascular Disease | Admitting: Cardiovascular Disease

## 2022-06-07 VITALS — BP 104/60 | HR 82 | Ht 72.0 in | Wt 153.1 lb

## 2022-06-07 DIAGNOSIS — I2510 Atherosclerotic heart disease of native coronary artery without angina pectoris: Secondary | ICD-10-CM

## 2022-06-07 DIAGNOSIS — E785 Hyperlipidemia, unspecified: Secondary | ICD-10-CM

## 2022-06-07 DIAGNOSIS — I10 Essential (primary) hypertension: Secondary | ICD-10-CM

## 2022-06-07 MED ORDER — ATORVASTATIN CALCIUM 40 MG PO TABS: 40.0000 mg | ORAL_TABLET | Freq: Every day | ORAL | 3 refills | Status: DC

## 2022-06-07 MED ORDER — LISINOPRIL 10 MG PO TABS: 10.0000 mg | ORAL_TABLET | Freq: Every day | ORAL | 3 refills | Status: DC

## 2022-06-07 NOTE — Patient Instructions (Signed)
Medication Instructions:   Your physician recommends that you continue on your current medications as directed. Please refer to the Current Medication list given to you today.  *If you need a refill on your cardiac medications before your next appointment, please call your pharmacy*   Lab Work:  None ordered  Testing/Procedures:  None ordered   Follow-Up: At [facility], you and your health needs are our priority.  As part of our continuing mission to provide you with exceptional heart care, we have created designated Provider Care Teams.  These Care Teams include your primary Cardiologist (physician) and Advanced Practice Providers (APPs -  Physician Assistants and Nurse Practitioners) who all work together to provide you with the care you need, when you need it.  We recommend signing up for the patient portal called "MyChart".  Sign up information is provided on this After Visit Summary.  MyChart is used to connect with patients for Virtual Visits (Telemedicine).  Patients are able to view lab/test results, encounter notes, upcoming appointments, etc.  Non-urgent messages can be sent to your provider as well.   To learn more about what you can do with MyChart, go to [email or web address].    Your next appointment:   12 month(s)  The format for your next appointment:   In Person  Provider:   You may see Ida Rogue, MD or one of the following Advanced Practice Providers on your designated Care Team:   Murray Hodgkins, NP Christell Faith, PA-C Cadence Kathlen Mody, PA-C Gerrie Nordmann, NP     Important Information About Sugar

## 2022-07-13 ENCOUNTER — Encounter: Payer: Self-pay | Admitting: Dermatology

## 2022-07-13 ENCOUNTER — Ambulatory Visit: Payer: Medicare Other | Admitting: Dermatology

## 2022-07-13 DIAGNOSIS — L2489 Irritant contact dermatitis due to other agents: Secondary | ICD-10-CM

## 2022-07-13 DIAGNOSIS — L578 Other skin changes due to chronic exposure to nonionizing radiation: Secondary | ICD-10-CM

## 2022-07-13 DIAGNOSIS — D0462 Carcinoma in situ of skin of left upper limb, including shoulder: Secondary | ICD-10-CM | POA: Diagnosis not present

## 2022-07-13 DIAGNOSIS — L570 Actinic keratosis: Secondary | ICD-10-CM

## 2022-07-13 DIAGNOSIS — Z79899 Other long term (current) drug therapy: Secondary | ICD-10-CM

## 2022-07-13 DIAGNOSIS — Z5111 Encounter for antineoplastic chemotherapy: Secondary | ICD-10-CM

## 2022-07-13 DIAGNOSIS — D492 Neoplasm of unspecified behavior of bone, soft tissue, and skin: Secondary | ICD-10-CM

## 2022-07-13 DIAGNOSIS — C4492 Squamous cell carcinoma of skin, unspecified: Secondary | ICD-10-CM

## 2022-07-13 HISTORY — DX: Squamous cell carcinoma of skin, unspecified: C44.92

## 2022-07-13 NOTE — Progress Notes (Signed)
Follow-Up Visit   Subjective  Patrick Malone is a 88 y.o. male who presents for the following: hx of rash (Forehead, ~89mago, has since resolved, pt had been using 5FU/Calcipotriene cr to spot treat and then used neosporin on forehead and then area started burning) and check spot (L upper arm, ~69msore prn).  The following portions of the chart were reviewed this encounter and updated as appropriate:   Tobacco  Allergies  Meds  Problems  Med Hx  Surg Hx  Fam Hx     Review of Systems:  No other skin or systemic complaints except as noted in HPI or Assessment and Plan.  Objective  Well appearing patient in no apparent distress; mood and affect are within normal limits.  A focused examination was performed including face, scalp, ears, left arm. Relevant physical exam findings are noted in the Assessment and Plan.  L lat upper arm 2.5 x 1.5cm pink scaly plaque     forehead Erythema forehead  scalp/forehead, L ear x 20 (20) Hyperkeratotic scaly macules   Assessment & Plan   Actinic Damage - Severe, confluent actinic changes with pre-cancerous actinic keratoses  - Severe, chronic, not at goal, secondary to cumulative UV radiation exposure over time - diffuse scaly erythematous macules and papules with underlying dyspigmentation - Discussed Prescription "Field Treatment" for Severe, Chronic Confluent Actinic Changes with Pre-Cancerous Actinic Keratoses Field treatment involves treatment of an entire area of skin that has confluent Actinic Changes (Sun/ Ultraviolet light damage) and PreCancerous Actinic Keratoses by method of PhotoDynamic Therapy (PDT) and/or prescription Topical Chemotherapy agents such as 5-fluorouracil, 5-fluorouracil/calcipotriene, and/or imiquimod.  The purpose is to decrease the number of clinically evident and subclinical PreCancerous lesions to prevent progression to development of skin cancer by chemically destroying early precancer changes that may  or may not be visible.  It has been shown to reduce the risk of developing skin cancer in the treated area. As a result of treatment, redness, scaling, crusting, and open sores may occur during treatment course. One or more than one of these methods may be used and may have to be used several times to control, suppress and eliminate the PreCancerous changes. Discussed treatment course, expected reaction, and possible side effects. - Recommend daily broad spectrum sunscreen SPF 30+ to sun-exposed areas, reapply every 2 hours as needed.  - Staying in the shade or wearing long sleeves, sun glasses (UVA+UVB protection) and wide brim hats (4-inch brim around the entire circumference of the hat) are also recommended. - Call for new or changing lesions.  -On November 1,2023 Start 5-fluorouracil/calcipotriene cream twice a day for 7 days to affected areas including top of scalp and forehead. [facility] and advised the pharmacy will mail the prescription to their home. Patient provided with handout reviewing treatment course and side effects and advised to call or message [place] MyChart with any concerns.  Reviewed course of treatment and expected reaction.  Patient advised to expect inflammation and crusting and advised that erosions are possible.  Patient advised to be diligent with sun protection during and after treatment. Counseled to keep medication out of reach of children and pets.   Neoplasm of skin L lat upper arm Skin / nail biopsy Type of biopsy: tangential   Informed consent: discussed and consent obtained   Timeout: patient name, date of birth, surgical site, and procedure verified   Procedure prep:  Patient was prepped and  draped in usual sterile fashion Prep type:  Isopropyl alcohol Anesthesia: the lesion was anesthetized in a standard fashion   Anesthetic:  1% lidocaine w/ epinephrine 1-100,000 buffered w/  8.4% NaHCO3 Instrument used: flexible razor blade   Outcome: patient tolerated procedure well   Post-procedure details: sterile dressing applied and wound care instructions given   Dressing type: bandage and bacitracin    Specimen 1 - Surgical pathology Differential Diagnosis: R/O Skin Cancer  Check Margins: No 2.5 x 1.5cm pink scaly plaque  Irritant contact dermatitis due to other agents forehead 2ndary to 5FU/Calcipotriene cream D/C 5FU/Calcipotriene for now  Hypertrophic actinic keratosis (20) scalp/forehead, L ear x 20 Discussed Red light treatment, pt declines  Destruction of lesion - scalp/forehead, L ear x 20 Complexity: simple   Destruction method: cryotherapy   Informed consent: discussed and consent obtained   Timeout:  patient name, date of birth, surgical site, and procedure verified Lesion destroyed using liquid nitrogen: Yes   Region frozen until ice ball extended beyond lesion: Yes   Outcome: patient tolerated procedure well with no complications   Post-procedure details: wound care instructions given    Return in about 6 months (around 01/12/2023) for [place] f/u.  I, Patrick Malone, RMA, am acting as scribe for Patrick Ser, MD . Documentation: I have reviewed the above documentation for accuracy and completeness, and I agree with the above.  Patrick Ser, MD

## 2022-07-13 NOTE — Patient Instructions (Addendum)
On August 03, 2022 Start 5-fluorouracil/calcipotriene cream twice a day for 7 days to affected areas including top of scalp and forehead. Prescription sent to [facility] originally, pt will call to get refill today. Patient provided with contact information for pharmacy and advised the pharmacy will mail the prescription to their home. Patient provided with handout reviewing treatment course and side effects and advised to call or message [place] on MyChart with any concerns.  Your prescription was sent to [facility] in [place]. A representative from [facility] will contact you within 3 business hours to verify your address and insurance information to schedule a free delivery. If for any reason you do not receive a phone call from them, please reach out to them. Their phone number is [phone] and their hours are Monday-Friday 9:00 am-5:00 pm.    5-Fluorouracil/Calcipotriene Patient Education   Actinic keratoses are the dry, red scaly spots on the skin caused by sun damage. A portion of these spots can turn into skin cancer with time, and treating them can help prevent development of skin cancer.   Treatment of these spots requires removal of the defective skin cells. There are various ways to remove actinic keratoses, including freezing with liquid nitrogen, treatment with creams, or treatment with a blue light procedure in the office.   5-fluorouracil cream is a topical cream used to treat actinic keratoses. It works by interfering with the growth of abnormal fast-growing skin cells, such as actinic keratoses. These cells peel off and are replaced by healthy ones.   5-fluorouracil/calcipotriene is a combination of the 5-fluorouracil cream with a vitamin D analog cream called calcipotriene. The calcipotriene alone does not treat actinic keratoses. However, when it is combined with 5-fluorouracil, it helps the 5-fluorouracil treat the actinic keratoses much faster so that the same  results can be achieved with a much shorter treatment time.  INSTRUCTIONS FOR 5-FLUOROURACIL/CALCIPOTRIENE CREAM:   5-fluorouracil/calcipotriene cream typically only needs to be used for 4-7 days. A thin layer should be applied twice a day to the treatment areas recommended by your physician.   If your physician prescribed you separate tubes of 5-fluourouracil and calcipotriene, apply a thin layer of 5-fluorouracil followed by a thin layer of calcipotriene.   Avoid contact with your eyes, nostrils, and mouth. Do not use 5-fluorouracil/calcipotriene cream on infected or open wounds.   You will develop redness, irritation and some crusting at areas where you have pre-cancer damage/actinic keratoses. IF YOU DEVELOP PAIN, BLEEDING, OR SIGNIFICANT CRUSTING, STOP THE TREATMENT EARLY - you have already gotten a good response and the actinic keratoses should clear up well.  Wash your hands after applying 5-fluorouracil 5% cream on your skin.   A moisturizer or sunscreen with a minimum SPF 30 should be applied each morning.   Once you have finished the treatment, you can apply a thin layer of Vaseline twice a day to irritated areas to soothe and calm the areas more quickly. If you experience significant discomfort, contact your physician.  For some patients it is necessary to repeat the treatment for best results.  SIDE EFFECTS: When using 5-fluorouracil/calcipotriene cream, you may have mild irritation, such as redness, dryness, swelling, or a mild burning sensation. This usually resolves within 2 weeks. The more actinic keratoses you have, the more redness and inflammation you can expect during treatment. Eye irritation has been reported rarely. If this occurs, please let us know.  If you have any trouble using this cream, please call the office. If you  have any other questions about this information, please do not hesitate to ask me before you leave the office.  Wound Care Instructions  Cleanse  wound gently with soap and water once a day then pat dry with clean gauze. Apply a thin coat of Petrolatum (petroleum jelly, "Vaseline") over the wound (unless you have an allergy to this). We recommend that you use a new, sterile tube of Vaseline. Do not pick or remove scabs. Do not remove the yellow or white "healing tissue" from the base of the wound.  Cover the wound with fresh, clean, nonstick gauze and secure with paper tape. You may use Band-Aids in place of gauze and tape if the wound is small enough, but would recommend trimming much of the tape off as there is often too much. Sometimes Band-Aids can irritate the skin.  You should call the office for your biopsy report after 1 week if you have not already been contacted.  If you experience any problems, such as abnormal amounts of bleeding, swelling, significant bruising, significant pain, or evidence of infection, please call the office immediately.  FOR ADULT SURGERY PATIENTS: If you need something for pain relief you may take 1 extra strength Tylenol (acetaminophen) AND 2 Ibuprofen ('200mg'$  each) together every 4 hours as needed for pain. (do not take these if you are allergic to them or if you have a reason you should not take them.) Typically, you may only need pain medication for 1 to 3 days.     Reviewed course of treatment and expected reaction.  Patient advised to expect inflammation and crusting and advised that erosions are possible.  Patient advised to be diligent with sun protection during and after treatment. Counseled to keep medication out of reach of children and pets.    Due to recent changes in healthcare laws, you may see results of your pathology and/or laboratory studies on MyChart before the doctors have had a chance to review them. We understand that in some cases there may be results that are confusing or concerning to you. Please understand that not all results are received at the same time and often the doctors may  need to interpret multiple results in order to provide you with the best plan of care or course of treatment. Therefore, we ask that you please give [place] 2 business days to thoroughly review all your results before contacting the office for clarification. Should we see a critical lab result, you will be contacted sooner.   If You Need Anything After Your Visit  If you have any questions or concerns for your doctor, please call our main line at [phone] and press option 4 to reach your doctor's medical assistant. If no one answers, please leave a voicemail as directed and we will return your call as soon as possible. Messages left after 4 pm will be answered the following business day.   You may also send [place] a message via [place]. We typically respond to MyChart messages within 1-2 business days.  For prescription refills, please ask your pharmacy to contact our office. Our fax number is [phone].  If you have an urgent issue when the clinic is closed that cannot wait until the next business day, you can page your doctor at the number below.    Please note that while we do our best to be available for urgent issues outside of office hours, we are not available 24/7.   If you have an urgent issue and are unable to  reach [place], you may choose to seek medical care at your doctor's office, retail clinic, urgent care center, or emergency room.  If you have a medical emergency, please immediately call 911 or go to the emergency department.  Pager Numbers  - Dr. Nehemiah Massed: [phone]  - Dr. Laurence Ferrari: [phone]  - Dr. Nicole Kindred: [phone]  In the event of inclement weather, please call our main line at [phone] for an update on the status of any delays or closures.  Dermatology Medication Tips: Please keep the boxes that topical medications come in in order to help keep track of the instructions about where and how to use these. Pharmacies typically print the medication instructions only  on the boxes and not directly on the medication tubes.   If your medication is too expensive, please contact our office at [phone] option 4 or send [place] a message through [place].   We are unable to tell what your co-pay for medications will be in advance as this is different depending on your insurance coverage. However, we may be able to find a substitute medication at lower cost or fill out paperwork to get insurance to cover a needed medication.   If a prior authorization is required to get your medication covered by your insurance company, please allow [place] 1-2 business days to complete this process.  Drug prices often vary depending on where the prescription is filled and some pharmacies may offer cheaper prices.  The website www.goodrx.com contains coupons for medications through different pharmacies. The prices here do not account for what the cost may be with help from insurance (it may be cheaper with your insurance), but the website can give you the price if you did not use any insurance.  - You can print the associated coupon and take it with your prescription to the pharmacy.  - You may also stop by our office during regular business hours and pick up a GoodRx coupon card.  - If you need your prescription sent electronically to a different pharmacy, notify our office through [facility] or by phone at [phone] option 4.     Si Usted Necesita Algo Despus de Su Visita  Tambin puede enviarnos un mensaje a travs de Pharmacist, community. Por lo general respondemos a los mensajes de MyChart en el transcurso de 1 a 2 das hbiles.  Para renovar recetas, por favor pida a su farmacia que se ponga en contacto con nuestra oficina. Harland Dingwall de fax es [place] [phone].  Si tiene un asunto urgente cuando la clnica est cerrada y que no puede esperar hasta el siguiente da hbil, puede llamar/localizar a su doctor(a) al nmero que aparece a continuacin.   Por favor, tenga en cuenta  que aunque hacemos todo lo posible para estar disponibles para asuntos urgentes fuera del horario de [place], no estamos disponibles las 24 horas del da, los 7 das de la [place].   Si tiene un problema urgente y no puede comunicarse con nosotros, puede optar por buscar atencin mdica  en el consultorio de su doctor(a), en una clnica privada, en un centro de atencin urgente o en una sala de emergencias.  Si tiene Engineering geologist, por favor llame inmediatamente al 911 o vaya a la sala de emergencias.  Nmeros de bper  - Dr. Nehemiah Massed: [phone]  - Dra. Moye: [phone]  - Dra. Nicole Kindred: [phone]  En caso de inclemencias del [place], por favor llame a Johnsie Kindred principal al [phone] para una actualizacin sobre el [place] de cualquier  retraso o cierre.  Consejos para la medicacin en dermatologa: Por favor, guarde las cajas en las que vienen los medicamentos de uso tpico para ayudarle a seguir las instrucciones sobre dnde y cmo usarlos. Las farmacias generalmente imprimen las instrucciones del medicamento slo en las cajas y no directamente en los tubos del [place].   Si su medicamento es muy caro, por favor, pngase en contacto con Zigmund Daniel llamando al [phone] y presione la opcin 4 o envenos un mensaje a travs de Pharmacist, community.   No podemos decirle cul ser su copago por los medicamentos por adelantado ya que esto es diferente dependiendo de la cobertura de su seguro. Sin embargo, es posible que podamos encontrar un medicamento sustituto a Electrical engineer un formulario para que el seguro cubra el medicamento que se considera necesario.   Si se requiere una autorizacin previa para que su compaa de seguros [place] su medicamento, por favor permtanos de 1 a 2 das hbiles para completar este proceso.  Los precios de los medicamentos varan con frecuencia dependiendo del Environmental consultant de dnde se surte la receta y alguna farmacias pueden ofrecer precios ms  baratos.  El sitio web www.goodrx.com tiene cupones para medicamentos de Airline pilot. Los precios aqu no tienen en cuenta lo que podra costar con la ayuda del seguro (puede ser ms barato con su seguro), pero el sitio web puede darle el precio si no utiliz Research scientist (physical sciences).  - Puede imprimir el cupn correspondiente y llevarlo con su receta a la farmacia.  - Tambin puede pasar por nuestra oficina durante el horario de atencin regular y Charity fundraiser una tarjeta de cupones de GoodRx.  - Si necesita que su receta se enve electrnicamente a una farmacia diferente, informe a nuestra oficina a travs de MyChart de [place] o por telfono llamando al [phone] y presione la opcin 4.

## 2022-07-18 ENCOUNTER — Telehealth: Payer: Self-pay

## 2022-07-18 NOTE — Telephone Encounter (Signed)
-----   Message from Ralene Bathe, MD sent at 07/18/2022 12:46 PM EDT ----- Diagnosis Skin , left lat upper arm SQUAMOUS CELL CARCINOMA IN SITU, BASE INVOLVED  Cancer - SCCis Superficial Schedule for treatment (EDC)

## 2022-07-18 NOTE — Telephone Encounter (Signed)
Advised pt of bx results and scheduled pt for EDC./sh 

## 2022-07-19 ENCOUNTER — Encounter: Payer: Self-pay | Admitting: Dermatology

## 2022-08-08 ENCOUNTER — Ambulatory Visit: Payer: Self-pay

## 2022-08-08 DIAGNOSIS — Z23 Encounter for immunization: Secondary | ICD-10-CM

## 2022-08-08 NOTE — Progress Notes (Signed)
Pt presents today to receive influenza vaccine, Pt tolerated well. Right Deltoid.

## 2022-08-09 ENCOUNTER — Ambulatory Visit: Payer: Medicare Other | Admitting: Dermatology

## 2022-08-09 DIAGNOSIS — L821 Other seborrheic keratosis: Secondary | ICD-10-CM | POA: Diagnosis not present

## 2022-08-09 DIAGNOSIS — L578 Other skin changes due to chronic exposure to nonionizing radiation: Secondary | ICD-10-CM

## 2022-08-09 DIAGNOSIS — D0462 Carcinoma in situ of skin of left upper limb, including shoulder: Secondary | ICD-10-CM | POA: Diagnosis not present

## 2022-08-09 DIAGNOSIS — D099 Carcinoma in situ, unspecified: Secondary | ICD-10-CM

## 2022-08-09 DIAGNOSIS — L570 Actinic keratosis: Secondary | ICD-10-CM | POA: Diagnosis not present

## 2022-08-09 NOTE — Progress Notes (Signed)
Follow-Up Visit   Subjective  Patrick Malone is a 88 y.o. male who presents for the following: SCCIS (L lat upper arm - bx proven, patient here today for [facility]). The patient has spots, moles and lesions to be evaluated, some may be new or changing and the patient has concerns that these could be cancer.  The following portions of the chart were reviewed this encounter and updated as appropriate:   Tobacco  Allergies  Meds  Problems  Med Hx  Surg Hx  Fam Hx     Review of Systems:  No other skin or systemic complaints except as noted in HPI or Assessment and Plan.  Objective  Well appearing patient in no apparent distress; mood and affect are within normal limits.  A focused examination was performed including the face and arms. Relevant physical exam findings are noted in the Assessment and Plan.  L lat upper arm 3.0 cm Pink patch   Scalp x 10 (10) Erythematous thin papules/macules with gritty scale.    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  Destruction of lesion Complexity: extensive   Destruction method: electrodesiccation and curettage   Informed consent: discussed and consent obtained   Timeout:  patient name, date of birth, surgical site, and procedure verified Procedure prep:  Patient was prepped and draped in usual sterile fashion Prep type:  Isopropyl alcohol Anesthesia: the lesion was anesthetized in a standard fashion   Anesthetic:  1% lidocaine w/ epinephrine 1-100,000 buffered w/ 8.4% NaHCO3 Curettage performed in three different directions: Yes   Electrodesiccation performed over the curetted area: Yes   Lesion length (cm):  3 Lesion width (cm):  3 Margin per side (cm):  0.2 Final wound size (cm):  3.4 Hemostasis achieved with:  pressure, aluminum chloride and electrodesiccation Outcome: patient tolerated procedure well with no complications   Post-procedure details: sterile dressing applied and wound care instructions  given   Dressing type: bandage and petrolatum    Bx proven - Discussed LN2 in conjunction with topical chemotherapy cream vs ED&C today. Patient prefers ED&C.   AK (actinic keratosis) (10) Scalp x 10  Hypertrophic - consider bx on the scalp if not resolved with LN2  Destruction of lesion - Scalp x 10 Complexity: simple   Destruction method: cryotherapy   Informed consent: discussed and consent obtained   Timeout:  patient name, date of birth, surgical site, and procedure verified Lesion destroyed using liquid nitrogen: Yes   Region frozen until ice ball extended beyond lesion: Yes   Outcome: patient tolerated procedure well with no complications   Post-procedure details: wound care instructions given    Actinic Damage - chronic, secondary to cumulative UV radiation exposure/sun exposure over time - diffuse scaly erythematous macules with underlying dyspigmentation - Recommend daily broad spectrum sunscreen SPF 30+ to sun-exposed areas, reapply every 2 hours as needed.  - Recommend staying in the shade or wearing long sleeves, sun glasses (UVA+UVB protection) and wide brim hats (4-inch brim around the entire circumference of the hat). - Call for new or changing lesions.  Seborrheic Keratoses - Stuck-on, waxy, tan-brown papules and/or plaques  - Benign-appearing - Discussed benign etiology and prognosis. - Observe - Call for any changes  Return for appointment as scheduled.  Luther Redo, CMA, am acting as scribe for [name], MD . Documentation: I have reviewed the above documentation for accuracy and completeness, and I agree with the above.  [name], MD

## 2022-08-09 NOTE — Patient Instructions (Signed)
Electrodesiccation and Curettage ("Scrape and Burn") Wound Care Instructions  Leave the original bandage on for 24 hours if possible.  If the bandage becomes soaked or soiled before that time, it is OK to remove it and examine the wound.  A small amount of post-operative bleeding is normal.  If excessive bleeding occurs, remove the bandage, place gauze over the site and apply continuous pressure (no peeking) over the area for 30 minutes. If this does not work, please call our clinic as soon as possible or page your doctor if it is after hours.   Once a day, cleanse the wound with soap and water. It is fine to shower. If a thick crust develops you may use a Q-tip dipped into dilute hydrogen peroxide (mix 1:1 with water) to dissolve it.  Hydrogen peroxide can slow the healing process, so use it only as needed.    After washing, apply petroleum jelly (Vaseline) or an antibiotic ointment if your doctor prescribed one for you, followed by a bandage.    For best healing, the wound should be covered with a layer of ointment at all times. If you are not able to keep the area covered with a bandage to hold the ointment in place, this may mean re-applying the ointment several times a day.  Continue this wound care until the wound has healed and is no longer open. It may take several weeks for the wound to heal and close.  Itching and mild discomfort is normal during the healing process.  If you have any discomfort, you can take Tylenol (acetaminophen) or ibuprofen as directed on the bottle. (Please do not take these if you have an allergy to them or cannot take them for another reason).  Some redness, tenderness and white or yellow material in the wound is normal healing.  If the area becomes very sore and red, or develops a thick yellow-green material (pus), it may be infected; please notify us.    Wound healing continues for up to one year following surgery. It is not unusual to experience pain in the scar  from time to time during the interval.  If the pain becomes severe or the scar thickens, you should notify the office.    A slight amount of redness in a scar is expected for the first six months.  After six months, the redness will fade and the scar will soften and fade.  The color difference becomes less noticeable with time.  If there are any problems, return for a post-op surgery check at your earliest convenience.  To improve the appearance of the scar, you can use silicone scar gel, cream, or sheets (such as Mederma or Serica) every night for up to one year. These are available over the counter (without a prescription).  Please call our office at [phone] for any questions or concerns.     Due to recent changes in healthcare laws, you may see results of your pathology and/or laboratory studies on MyChart before the doctors have had a chance to review them. We understand that in some cases there may be results that are confusing or concerning to you. Please understand that not all results are received at the same time and often the doctors may need to interpret multiple results in order to provide you with the best plan of care or course of treatment. Therefore, we ask that you please give us 2 business days to thoroughly review all your results before contacting the office for clarification. Should   we see a critical lab result, you will be contacted sooner.   If You Need Anything After Your Visit  If you have any questions or concerns for your doctor, please call our main line at [phone] and press option 4 to reach your doctor's medical assistant. If no one answers, please leave a voicemail as directed and we will return your call as soon as possible. Messages left after 4 pm will be answered the following business day.   You may also send us a message via MyChart. We typically respond to MyChart messages within 1-2 business days.  For prescription refills, please ask your  pharmacy to contact our office. Our fax number is [phone].  If you have an urgent issue when the clinic is closed that cannot wait until the next business day, you can page your doctor at the number below.    Please note that while we do our best to be available for urgent issues outside of office hours, we are not available 24/7.   If you have an urgent issue and are unable to reach us, you may choose to seek medical care at your doctor's office, retail clinic, urgent care center, or emergency room.  If you have a medical emergency, please immediately call 911 or go to the emergency department.  Pager Numbers  - Dr. Kowalski: [phone]  - Dr. Moye: [phone]  - Dr. Stewart: [phone]  In the event of inclement weather, please call our main line at [phone] for an update on the status of any delays or closures.  Dermatology Medication Tips: Please keep the boxes that topical medications come in in order to help keep track of the instructions about where and how to use these. Pharmacies typically print the medication instructions only on the boxes and not directly on the medication tubes.   If your medication is too expensive, please contact our office at [phone] option 4 or send us a message through MyChart.   We are unable to tell what your co-pay for medications will be in advance as this is different depending on your insurance coverage. However, we may be able to find a substitute medication at lower cost or fill out paperwork to get insurance to cover a needed medication.   If a prior authorization is required to get your medication covered by your insurance company, please allow us 1-2 business days to complete this process.  Drug prices often vary depending on where the prescription is filled and some pharmacies may offer cheaper prices.  The website www.goodrx.com contains coupons for medications through different pharmacies. The prices here do not  account for what the cost may be with help from insurance (it may be cheaper with your insurance), but the website can give you the price if you did not use any insurance.  - You can print the associated coupon and take it with your prescription to the pharmacy.  - You may also stop by our office during regular business hours and pick up a GoodRx coupon card.  - If you need your prescription sent electronically to a different pharmacy, notify our office through [facility] MyChart or by phone at [phone] option 4.     Si Usted Necesita Algo Despus de Su Visita  Tambin puede enviarnos un mensaje a travs de MyChart. Por lo general respondemos a los mensajes de MyChart en el transcurso de 1 a 2 das hbiles.  Para renovar recetas, por favor pida a su farmacia que se ponga en   contacto con nuestra oficina. Nuestro nmero de fax es el [phone].  Si tiene un asunto urgente cuando la clnica est cerrada y que no puede esperar hasta el siguiente da hbil, puede llamar/localizar a su doctor(a) al nmero que aparece a continuacin.   Por favor, tenga en cuenta que aunque hacemos todo lo posible para estar disponibles para asuntos urgentes fuera del horario de oficina, no estamos disponibles las 24 horas del da, los 7 das de la semana.   Si tiene un problema urgente y no puede comunicarse con nosotros, puede optar por buscar atencin mdica  en el consultorio de su doctor(a), en una clnica privada, en un centro de atencin urgente o en una sala de emergencias.  Si tiene una emergencia mdica, por favor llame inmediatamente al 911 o vaya a la sala de emergencias.  Nmeros de bper  - Dr. Kowalski: [phone]  - Dra. Moye: [phone]  - Dra. Stewart: [phone]  En caso de inclemencias del tiempo, por favor llame a nuestra lnea principal al [phone] para una actualizacin sobre el estado de cualquier retraso o cierre.  Consejos para la medicacin en dermatologa: Por  favor, guarde las cajas en las que vienen los medicamentos de uso tpico para ayudarle a seguir las instrucciones sobre dnde y cmo usarlos. Las farmacias generalmente imprimen las instrucciones del medicamento slo en las cajas y no directamente en los tubos del medicamento.   Si su medicamento es muy caro, por favor, pngase en contacto con nuestra oficina llamando al [phone] y presione la opcin 4 o envenos un mensaje a travs de MyChart.   No podemos decirle cul ser su copago por los medicamentos por adelantado ya que esto es diferente dependiendo de la cobertura de su seguro. Sin embargo, es posible que podamos encontrar un medicamento sustituto a menor costo o llenar un formulario para que el seguro cubra el medicamento que se considera necesario.   Si se requiere una autorizacin previa para que su compaa de seguros cubra su medicamento, por favor permtanos de 1 a 2 das hbiles para completar este proceso.  Los precios de los medicamentos varan con frecuencia dependiendo del lugar de dnde se surte la receta y alguna farmacias pueden ofrecer precios ms baratos.  El sitio web www.goodrx.com tiene cupones para medicamentos de diferentes farmacias. Los precios aqu no tienen en cuenta lo que podra costar con la ayuda del seguro (puede ser ms barato con su seguro), pero el sitio web puede darle el precio si no utiliz ningn seguro.  - Puede imprimir el cupn correspondiente y llevarlo con su receta a la farmacia.  - Tambin puede pasar por nuestra oficina durante el horario de atencin regular y recoger una tarjeta de cupones de GoodRx.  - Si necesita que su receta se enve electrnicamente a una farmacia diferente, informe a nuestra oficina a travs de MyChart de  o por telfono llamando al [phone] y presione la opcin 4.  

## 2022-08-19 ENCOUNTER — Encounter: Payer: Self-pay | Admitting: Dermatology

## 2023-01-12 ENCOUNTER — Ambulatory Visit: Payer: Medicare Other | Admitting: Dermatology

## 2023-04-06 ENCOUNTER — Emergency Department: Payer: Medicare Other

## 2023-04-06 ENCOUNTER — Emergency Department
Admission: EM | Admit: 2023-04-06 | Discharge: 2023-04-06 | Disposition: A | Discharge status: Home / Self Care | Payer: Medicare Other | Attending: Student in an Organized Health Care Education/Training Program | Admitting: Student in an Organized Health Care Education/Training Program

## 2023-04-06 ENCOUNTER — Other Ambulatory Visit: Payer: Self-pay

## 2023-04-06 DIAGNOSIS — R531 Weakness: Secondary | ICD-10-CM | POA: Diagnosis not present

## 2023-04-06 DIAGNOSIS — I10 Essential (primary) hypertension: Secondary | ICD-10-CM | POA: Insufficient documentation

## 2023-04-06 DIAGNOSIS — D72829 Elevated white blood cell count, unspecified: Secondary | ICD-10-CM | POA: Diagnosis not present

## 2023-04-06 DIAGNOSIS — N390 Urinary tract infection, site not specified: Secondary | ICD-10-CM | POA: Insufficient documentation

## 2023-04-06 DIAGNOSIS — R350 Frequency of micturition: Secondary | ICD-10-CM | POA: Diagnosis present

## 2023-04-06 LAB — CBC
HCT: 41.4 % (ref 39.0–52.0)
Hemoglobin: 13.3 g/dL (ref 13.0–17.0)
MCH: 29.1 pg (ref 26.0–34.0)
MCHC: 32.1 g/dL (ref 30.0–36.0)
MCV: 90.6 fL (ref 80.0–100.0)
Platelets: 206 K/uL (ref 150–400)
RBC: 4.57 MIL/uL (ref 4.22–5.81)
RDW: 14.5 % (ref 11.5–15.5)
WBC: 16.3 K/uL — ABNORMAL HIGH (ref 4.0–10.5)
nRBC: 0 % (ref 0.0–0.2)

## 2023-04-06 LAB — URINALYSIS, ROUTINE W REFLEX MICROSCOPIC
Bilirubin Urine: NEGATIVE
Glucose, UA: NEGATIVE mg/dL
Ketones, ur: NEGATIVE mg/dL
Nitrite: POSITIVE — AB
Protein, ur: 100 mg/dL — AB
Specific Gravity, Urine: 1.016 (ref 1.005–1.030)
WBC, UA: >50 WBC/hpf (ref 0–5)
pH: 6 (ref 5.0–8.0)

## 2023-04-06 LAB — COMPREHENSIVE METABOLIC PANEL WITH GFR
ALT: 13 U/L (ref 0–44)
AST: 25 U/L (ref 15–41)
Albumin: 4.2 g/dL (ref 3.5–5.0)
Alkaline Phosphatase: 85 U/L (ref 38–126)
Anion gap: 10 (ref 5–15)
BUN: 20 mg/dL (ref 8–23)
CO2: 21 mmol/L — ABNORMAL LOW (ref 22–32)
Calcium: 8.8 mg/dL — ABNORMAL LOW (ref 8.9–10.3)
Chloride: 104 mmol/L (ref 98–111)
Creatinine, Ser: 0.94 mg/dL (ref 0.61–1.24)
GFR, Estimated: >60 mL/min
Glucose, Bld: 93 mg/dL (ref 70–99)
Potassium: 4.5 mmol/L (ref 3.5–5.1)
Sodium: 135 mmol/L (ref 135–145)
Total Bilirubin: 1.3 mg/dL — ABNORMAL HIGH (ref 0.3–1.2)
Total Protein: 7.2 g/dL (ref 6.5–8.1)

## 2023-04-06 LAB — TROPONIN I (HIGH SENSITIVITY): Troponin I (High Sensitivity): 17 ng/L (ref <18)

## 2023-04-06 LAB — LIPASE, BLOOD: Lipase: 43 U/L (ref 11–51)

## 2023-04-06 LAB — LACTIC ACID, PLASMA: Lactic Acid, Venous: 1.7 mmol/L (ref 0.5–1.9)

## 2023-04-06 MED ORDER — SODIUM CHLORIDE 0.9 % IV SOLN
1.0000 g | Freq: Once | INTRAVENOUS | Status: AC
  Administered 2023-04-06: 1 g via INTRAVENOUS
  Filled 2023-04-06: qty 10

## 2023-04-06 MED ORDER — SODIUM CHLORIDE 0.9 % IV BOLUS
500.0000 mL | Freq: Once | INTRAVENOUS | Status: AC
  Administered 2023-04-06: 500 mL via INTRAVENOUS

## 2023-04-06 MED ORDER — CEPHALEXIN 500 MG PO CAPS: 500.0000 mg | ORAL_CAPSULE | Freq: Four times a day (QID) | ORAL | 0 refills | Status: AC

## 2023-04-06 NOTE — ED Provider Notes (Signed)
[facility] Provider Note    Event Date/Time   First MD Initiated Contact with Patient 04/06/23 1216     (approximate)   History   Urinary Frequency and Abdominal Pain   HPI  Patrick Malone is a 89 y.o. male with a history of MI, stents, hypertension, and clotting disorder presents to the emergency department complaining of weakness for several days.  Has lost a lot of his mobility over the last week due to being weak.  Also started having urinary frequency yesterday.  His daughter is present stating that he is not as mobile as he was.  The patient does live at home with his wife.  He states usually he was driving her everywhere she needed to go but over the past few days has been too weak to do this.  Stents were placed in 2013      Physical Exam   Triage Vital Signs: ED Triage Vitals  Enc Vitals Group     BP 04/06/23 1034 (!) 125/94     Pulse Rate 04/06/23 1031 (!) 102     Resp 04/06/23 1031 16     Temp 04/06/23 1030 98.8 F (37.1 C)     Temp src --      SpO2 04/06/23 1031 97 %     Weight 04/06/23 1030 150 lb (68 kg)     Height 04/06/23 1030 6' (1.829 m)     Head Circumference --      Peak Flow --      Pain Score 04/06/23 1030 2     Pain Loc --      Pain Edu? --      Excl. in GC? --     Most recent vital signs: Vitals:   04/06/23 1034 04/06/23 1434  BP: (!) 125/94 128/67  Pulse:  94  Resp:  16  Temp:  98.3 F (36.8 C)  SpO2:  96%     General: Awake, no distress.   CV:  Good peripheral perfusion. regular rate and  rhythm Resp:  Normal effort. Lungs cta Abd:  No distention.  Minimally tender over the bladder Other:  No swelling in the lower extremities   ED Results / Procedures / Treatments   Labs (all labs ordered are listed, but only abnormal results are displayed) Labs Reviewed  COMPREHENSIVE METABOLIC PANEL - Abnormal; Notable for the following components:      Result Value   CO2 21 (*)    Calcium 8.8 (*)     Total Bilirubin 1.3 (*)    All other components within normal limits  CBC - Abnormal; Notable for the following components:   WBC 16.3 (*)    All other components within normal limits  URINALYSIS, ROUTINE W REFLEX MICROSCOPIC - Abnormal; Notable for the following components:   Color, Urine YELLOW (*)    APPearance CLOUDY (*)    Hgb urine dipstick MODERATE (*)    Protein, ur 100 (*)    Nitrite POSITIVE (*)    Leukocytes,Ua MODERATE (*)    Bacteria, UA MANY (*)    All other components within normal limits  URINE CULTURE  LIPASE, BLOOD  LACTIC ACID, PLASMA  TROPONIN I (HIGH SENSITIVITY)  TROPONIN I (HIGH SENSITIVITY)     EKG  EKG   RADIOLOGY Chest x-ray    PROCEDURES:   Procedures   MEDICATIONS ORDERED IN ED: Medications  sodium chloride 0.9 % bolus 500 mL (0 mLs Intravenous Stopped 04/06/23 1420)  cefTRIAXone (ROCEPHIN) 1 g in sodium chloride 0.9 % 100 mL IVPB (0 g Intravenous Stopped 04/06/23 1420)     IMPRESSION / MDM / ASSESSMENT AND PLAN / ED COURSE  I reviewed the triage vital signs and the nursing notes.                              Differential diagnosis includes, but is not limited to, sepsis, UTI, MI, CAP  Patient's presentation is most consistent with acute presentation with potential threat to life or bodily function.   Due to the weakness we will do ACS and sepsis protocols.  Patient's labs are concerning for infection with elevated WBC of 16.3, urinalysis appears to have infection with positive nitrites, moderate leuks, white blood cell clumps and many bacteria.  Lactic and metabolic panel are reassuring   EKG shows tachycardia, see physician read  Cxr independently reviewed and interpreted by me as being negative for any acute abnormality  Discussed with Dr. Rosalia Hammers, she was in to see the patient.  Troponin is normal feel that ACS is less likely.   Patient's heart rate decreased to 94 with fluids.   In shared decision-making with the patient,  his daughter, Dr. Rosalia Hammers and  myself feel the patient is stable enough to be discharged.  We did consider admission due to the tachycardia along with infection but the patient would like to go home.  We will give him Rocephin here in the ED and Keflex 4 times daily for 10 days.  Patient did tolerate the Rocephin.  Strict instructions to return if worsening.  He and his daughter are in agreement with treatment plan.  He was given additional fluids while here in the ED.  Will order a urine culture for the urine  FINAL CLINICAL IMPRESSION(S) / ED DIAGNOSES   Final diagnoses:  Acute UTI     Rx / DC Orders   ED Discharge Orders          Ordered    cephALEXin (KEFLEX) 500 MG capsule  4 times daily        04/06/23 1448             Note:  This document was prepared using Dragon voice recognition software and may include unintentional dictation errors.    Faythe Ghee, PA-C 04/06/23 1451    Trinna Post, MD 04/06/23 [phone]

## 2023-04-06 NOTE — Discharge Instructions (Signed)
Pain plenty of water.  Take the antibiotic as prescribed.  Return emergency department if worsening.  If you begin to run fever, have vomiting, or feel weak or you need to come to the emergency department.

## 2023-04-06 NOTE — ED Triage Notes (Signed)
Pt to ED for urinary frequency started yesterday. Also reports lower abd pain for several days. +diarrhea.

## 2023-04-07 LAB — URINE CULTURE

## 2023-04-08 LAB — URINE CULTURE: Culture: >=100000 — AB

## 2023-04-27 ENCOUNTER — Emergency Department: Payer: Medicare Other

## 2023-04-27 ENCOUNTER — Emergency Department
Admission: EM | Admit: 2023-04-27 | Discharge: 2023-04-27 | Disposition: A | Discharge status: Home / Self Care | Payer: Medicare Other | Attending: Emergency Medicine | Admitting: Emergency Medicine

## 2023-04-27 ENCOUNTER — Other Ambulatory Visit: Payer: Self-pay

## 2023-04-27 ENCOUNTER — Encounter: Payer: Self-pay | Admitting: Emergency Medicine

## 2023-04-27 DIAGNOSIS — I25111 Atherosclerotic heart disease of native coronary artery with angina pectoris with documented spasm: Secondary | ICD-10-CM | POA: Insufficient documentation

## 2023-04-27 DIAGNOSIS — I10 Essential (primary) hypertension: Secondary | ICD-10-CM | POA: Diagnosis not present

## 2023-04-27 DIAGNOSIS — R600 Localized edema: Secondary | ICD-10-CM | POA: Insufficient documentation

## 2023-04-27 DIAGNOSIS — S3992XA Unspecified injury of lower back, initial encounter: Secondary | ICD-10-CM | POA: Diagnosis present

## 2023-04-27 DIAGNOSIS — N390 Urinary tract infection, site not specified: Secondary | ICD-10-CM | POA: Insufficient documentation

## 2023-04-27 DIAGNOSIS — S0990XA Unspecified injury of head, initial encounter: Secondary | ICD-10-CM | POA: Diagnosis not present

## 2023-04-27 DIAGNOSIS — W19XXXA Unspecified fall, initial encounter: Secondary | ICD-10-CM | POA: Diagnosis not present

## 2023-04-27 DIAGNOSIS — B9689 Other specified bacterial agents as the cause of diseases classified elsewhere: Secondary | ICD-10-CM | POA: Insufficient documentation

## 2023-04-27 DIAGNOSIS — S32039A Unspecified fracture of third lumbar vertebra, initial encounter for closed fracture: Secondary | ICD-10-CM | POA: Insufficient documentation

## 2023-04-27 DIAGNOSIS — S32030A Wedge compression fracture of third lumbar vertebra, initial encounter for closed fracture: Secondary | ICD-10-CM

## 2023-04-27 DIAGNOSIS — W01198A Fall on same level from slipping, tripping and stumbling with subsequent striking against other object, initial encounter: Secondary | ICD-10-CM | POA: Insufficient documentation

## 2023-04-27 LAB — COMPREHENSIVE METABOLIC PANEL
ALT: 23 U/L (ref 0–44)
AST: 28 U/L (ref 15–41)
Albumin: 3.7 g/dL (ref 3.5–5.0)
Alkaline Phosphatase: 85 U/L (ref 38–126)
Anion gap: 10 (ref 5–15)
BUN: 29 mg/dL — ABNORMAL HIGH (ref 8–23)
CO2: 23 mmol/L (ref 22–32)
Calcium: 8.8 mg/dL — ABNORMAL LOW (ref 8.9–10.3)
Chloride: 101 mmol/L (ref 98–111)
Creatinine, Ser: 1.17 mg/dL (ref 0.61–1.24)
GFR, Estimated: 60 mL/min — ABNORMAL LOW (ref >60)
Glucose, Bld: 103 mg/dL — ABNORMAL HIGH (ref 70–99)
Potassium: 4.5 mmol/L (ref 3.5–5.1)
Sodium: 134 mmol/L — ABNORMAL LOW (ref 135–145)
Total Bilirubin: 0.8 mg/dL (ref 0.3–1.2)
Total Protein: 7.2 g/dL (ref 6.5–8.1)

## 2023-04-27 LAB — URINALYSIS, W/ REFLEX TO CULTURE (INFECTION SUSPECTED)
Bilirubin Urine: NEGATIVE
Glucose, UA: NEGATIVE mg/dL
Ketones, ur: NEGATIVE mg/dL
Nitrite: NEGATIVE
Protein, ur: 100 mg/dL — AB
Specific Gravity, Urine: 1.016 (ref 1.005–1.030)
Squamous Epithelial / HPF: NONE SEEN /HPF (ref 0–5)
WBC, UA: >50 WBC/hpf (ref 0–5)
pH: 5 (ref 5.0–8.0)

## 2023-04-27 LAB — CBC
HCT: 37.5 % — ABNORMAL LOW (ref 39.0–52.0)
Hemoglobin: 12.5 g/dL — ABNORMAL LOW (ref 13.0–17.0)
MCH: 29.6 pg (ref 26.0–34.0)
MCHC: 33.3 g/dL (ref 30.0–36.0)
MCV: 88.7 fL (ref 80.0–100.0)
Platelets: 256 10*3/uL (ref 150–400)
RBC: 4.23 MIL/uL (ref 4.22–5.81)
RDW: 14.6 % (ref 11.5–15.5)
WBC: 9.9 10*3/uL (ref 4.0–10.5)
nRBC: 0 % (ref 0.0–0.2)

## 2023-04-27 LAB — BRAIN NATRIURETIC PEPTIDE: B Natriuretic Peptide: 81.4 pg/mL (ref 0.0–100.0)

## 2023-04-27 MED ORDER — ACETAMINOPHEN 325 MG PO TABS
650.0000 mg | ORAL_TABLET | Freq: Once | ORAL | Status: AC
  Administered 2023-04-27: 650 mg via ORAL
  Filled 2023-04-27: qty 2

## 2023-04-27 MED ORDER — CEFDINIR 300 MG PO CAPS: 300.0000 mg | ORAL_CAPSULE | Freq: Two times a day (BID) | ORAL | 0 refills | Status: AC

## 2023-04-27 MED ORDER — LIDOCAINE 5 % EX PTCH
1.0000 | MEDICATED_PATCH | CUTANEOUS | Status: DC
  Administered 2023-04-27: 1 via TRANSDERMAL
  Filled 2023-04-27: qty 1

## 2023-04-27 MED ORDER — SODIUM CHLORIDE 0.9 % IV SOLN
1.0000 g | INTRAVENOUS | Status: DC
  Administered 2023-04-27: 1 g via INTRAVENOUS
  Filled 2023-04-27: qty 10

## 2023-04-27 MED ORDER — LIDOCAINE 5 % EX PTCH: 1.0000 | MEDICATED_PATCH | Freq: Two times a day (BID) | CUTANEOUS | 0 refills | Status: AC

## 2023-04-27 NOTE — ED Notes (Signed)
[facility]  BRACE  CALLED  FOR

## 2023-04-27 NOTE — Discharge Instructions (Addendum)
Your CT scan shows a fracture of your L3 vertebrae.  You are also seen by the neurosurgeon who recommends that you wear the brace while moving around, though you may remove it at night. Please return to the emergency department for any new, worsening, or changing symptoms or other concerns including weakness in your legs, urinary or stool incontinence or retention, numbness or tingling in your extremities/buttocks/groin, fevers, or any other concerns or change in symptoms.  You were also found to have a urinary tract infection, please take the antibiotics as prescribed.  Please follow-up with your outpatient provider.  He may continue to take Tylenol 650 mg every 6-8 hours as needed for pain, as well as the Lidoderm patches that have been prescribed.  It was a pleasure caring for you today.

## 2023-04-27 NOTE — ED Provider Notes (Signed)
[facility] Provider Note    Event Date/Time   First MD Initiated Contact with Patient 04/27/23 1030     (approximate)   History   Fall   HPI  Patrick Malone is a 89 y.o. male with a past medical history of coronary artery disease status post stent placement, hypertension, hyperlipidemia presents today for evaluation of back pain.  Patient reports that he was diagnosed with a urinary tract infection on July 4, and has completed antibiotics.  He reports that he still urinating a lot even though the pain has resolved and he is concerned that he has a recurrence of his urinary tract infection.  Additionally, he has had lower extremity swelling for the past several months.  He reports that his daughter gave him compression stockings to use, the last night he had a slip and fall because the socks were slippery and he landed on his buttocks and back.  He reports that the paramedics helped him get up and he went back to sleep because he did not have much pain at that time.  He reports that today he has pain across his low back.  He has not had any urinary or fecal incontinence or retention.  He is not on anticoagulation.  He denies orthopnea or dyspnea on exertion.  No history of heart failure.  He denies any radiation of pain into his abdomen or legs.  Patient Active Problem List   Diagnosis Date Noted   S/P coronary artery stent placement 09/07/2015   Coronary artery disease involving native coronary artery of native heart with angina pectoris with documented spasm (HCC) 09/07/2015   Excessive sweating 09/07/2015   Weakness 09/07/2015   Secondary hypertension, unspecified 09/07/2015   Hyperlipidemia 09/07/2015          Physical Exam   Triage Vital Signs: ED Triage Vitals  Encounter Vitals Group     BP 04/27/23 0948 (!) 179/165     Systolic BP Percentile --      Diastolic BP Percentile --      Pulse Rate 04/27/23 0948 95     Resp 04/27/23 0948 18      Temp 04/27/23 0948 97.9 F (36.6 C)     Temp Source 04/27/23 0948 Oral     SpO2 04/27/23 0948 96 %     Weight 04/27/23 0946 150 lb (68 kg)     Height 04/27/23 0946 6' (1.829 m)     Head Circumference --      Peak Flow --      Pain Score 04/27/23 0946 10     Pain Loc --      Pain Education --      Exclude from Growth Chart --     Most recent vital signs: Vitals:   04/27/23 0948 04/27/23 1023  BP: (!) 179/165 (!) 170/110  Pulse: 95 90  Resp: 18 18  Temp: 97.9 F (36.6 C)   SpO2: 96% 96%    Physical Exam Vitals and nursing note reviewed.  Constitutional:      General: Awake and alert. No acute distress.    Appearance: Normal appearance. The patient is normal weight.  HENT:     Head: Normocephalic and atraumatic.     Mouth: Mucous membranes are moist.  Eyes:     General: PERRL. Normal EOMs        Right eye: No discharge.        Left eye: No discharge.     Conjunctiva/sclera:  Conjunctivae normal.  Cardiovascular:     Rate and Rhythm: Normal rate and regular rhythm.     Pulses: Normal pulses.  Pulmonary:     Effort: Pulmonary effort is normal. No respiratory distress.     Breath sounds: Normal breath sounds.  No JVD Abdominal:     Abdomen is soft. There is no abdominal tenderness. No rebound or guarding. No distention. Musculoskeletal:        General: No swelling. Normal range of motion.     Cervical back: Normal range of motion and neck supple.  Back: Mild lumbar midline and paraspinal muscle tenderness.  Tenderness across lumbar area bilaterally without acute overlying skin color changes.  Pain with attempting to flex hips bilaterally.  Normal internal and external rotation of hips bilaterally.  Normal great toe extension against resistance. Normal sensation throughout feet. Normal patellar reflexes. Negative SLR and opposite SLR bilaterally.  Pelvis stable.  Negative logroll bilaterally. 2+ edema bilaterally, nonpitting Skin:    General: Skin is warm and dry.      Capillary Refill: Capillary refill takes less than 2 seconds.     Findings: No rash.  Neurological:     Mental Status: The patient is awake and alert.      ED Results / Procedures / Treatments   Labs (all labs ordered are listed, but only abnormal results are displayed) Labs Reviewed  CBC - Abnormal; Notable for the following components:      Result Value   Hemoglobin 12.5 (*)    HCT 37.5 (*)    All other components within normal limits  COMPREHENSIVE METABOLIC PANEL - Abnormal; Notable for the following components:   Sodium 134 (*)    Glucose, Bld 103 (*)    BUN 29 (*)    Calcium 8.8 (*)    GFR, Estimated 60 (*)    All other components within normal limits  URINALYSIS, W/ REFLEX TO CULTURE (INFECTION SUSPECTED) - Abnormal; Notable for the following components:   Color, Urine AMBER (*)    APPearance TURBID (*)    Hgb urine dipstick SMALL (*)    Protein, ur 100 (*)    Leukocytes,Ua LARGE (*)    Bacteria, UA FEW (*)    All other components within normal limits  URINE CULTURE  BRAIN NATRIURETIC PEPTIDE     EKG     RADIOLOGY I independently reviewed and interpreted imaging and agree with radiologists findings.     PROCEDURES:  Critical Care performed:   Procedures   MEDICATIONS ORDERED IN ED: Medications  cefTRIAXone (ROCEPHIN) 1 g in sodium chloride 0.9 % 100 mL IVPB (0 g Intravenous Stopped 04/27/23 1254)  lidocaine (LIDODERM) 5 % 1 patch (1 patch Transdermal Patch Applied 04/27/23 1225)  acetaminophen (TYLENOL) tablet 650 mg (650 mg Oral Given 04/27/23 1221)     IMPRESSION / MDM / ASSESSMENT AND PLAN / ED COURSE  I reviewed the triage vital signs and the nursing notes.   Differential diagnosis includes, but is not limited to, dependent edema, vascular insufficiency, volume overload, compression fracture, urinary tract infection.  Patient is awake and alert, hemodynamically stable and afebrile.  I reviewed the patient's chart.  Patient was seen in the  emergency department on 04/06/2023 with complaint of weakness for the past several days, and was found to have a urinary tract infection.  He had leukocytosis to 16.3 and a grossly positive urinalysis he was treated with Rocephin, fluids, and discharged on Keflex.  He was subsequently seen in clinic by  his primary care provider and still complained of urinary frequency and urgency so urine culture was sent.  Urine culture reveals pansensitive E. coli.  CT head and neck obtained in triage per [place] criteria and negative for any acute findings.  Blood work also obtained in triage is negative for acute abnormality.  He does have symmetric edema in his bilateral lower extremities, which I suspect is from vascular insufficiency versus dependent edema.  He has no calf tenderness or history of blood clots, and this has been ongoing for the past several months.  He does not appear to be volume overloaded, he has no rales on exam, no JVD, no orthopnea, no dyspnea on exertion, and his BNP is within normal limits.  Given his slip and fall last night and his tenderness to his lumbar spine, CT scan was obtained which revealed an L3 burst type compression fracture.  Dr. Marcell Barlow with neurosurgery was consulted and evaluated the patient in the emergency department.  He feels that this is amenable to nonsurgical management and recommends an LSO brace.  If patient requires pain control, then admission to the hospitalist service is indicated.  However, patient reports that his pain is well-controlled with Tylenol and Lidoderm patch, and he does not wish to be admitted.  His daughter with him also feels comfortable with him being discharged home.  Patient reported feeling significantly improved after the LSO brace arrived.  He is able to ambulate with a steady gait with his walker which she uses at home unassisted.  He does not wish to be admitted for pain control.  He wishes to go home which his daughter is comfortable with.   Daughter also reports that the patient's other daughter has just arranged home health services for him.  Patient was given another dose of Rocephin for his urinary tract infection.  He has no CVA tenderness or fever to suggest pyelonephritis.  Will treat with third-generation antibiotic for better efficacy.  Recommended close outpatient follow-up for recheck for this.  Patient was discharged in stable condition.    Patient's presentation is most consistent with acute presentation with potential threat to life or bodily function.   Clinical Course as of 04/27/23 1506  Thu Apr 27, 2023  1211 Discussed with Dr. Marcell Barlow with neurosurgery who will come to see the patient to the emergency department.  He has asked for LSO brace quick draw [JP]  1246 Per Dr. Marcell Barlow, he has evaluated the patient.  He feels that he will be appropriate for discharge home if his pain is controlled with LSO brace, and admit if pain is not controlled. [JP]  1339 Patient and her daughter reports that he does not wish to be admitted.  He feels significantly improved with the Tylenol and Lidoderm patches.  We are still awaiting the arrival of the LSO brace [JP]  1500 Patient reports significant relief with the LSO brace.  He is able to walk unassisted with a walker.  He does not wish to be admitted for pain control. [JP]    Clinical Course User Index [JP] Zakhia Seres, Herb Grays, PA-C     FINAL CLINICAL IMPRESSION(S) / ED DIAGNOSES   Final diagnoses:  Closed compression fracture of L3 lumbar vertebra, initial encounter (HCC)  Lower urinary tract infectious disease  Fall, initial encounter     Rx / DC Orders   ED Discharge Orders          Ordered    cefdinir (OMNICEF) 300 MG capsule  2 times  daily        04/27/23 1434    lidocaine (LIDODERM) 5 %  Every 12 hours        04/27/23 1434             Note:  This document was prepared using Dragon voice recognition software and may include unintentional  dictation errors.   Keturah Shavers 04/27/23 1506    Jene Every, MD 04/28/23 1059

## 2023-04-27 NOTE — ED Triage Notes (Signed)
Pt here via [facility] with a fall 16 hrs ago. Pt c/o back pain, cannot stand. Pt having feet and knee swelling since July. Pt also c/o frequent urination since July 4. Pt states he got rid of the infx but is still having frequent urination. Pt denies hitting his head and landed on his back.

## 2023-04-27 NOTE — Consult Note (Signed)
Consult requested by:  Patrick Beal PA  Consult requested for:  L3 fracture  Primary Physician:  Dorothey Baseman, MD  History of Present Illness: 04/27/2023 Patrick Malone is here today with a chief complaint of back pain after a fall.  He fell directly on the floor.  He has no new complaints other than back pain.  He has no weakness or numbness.   I have utilized the care everywhere function in epic to review the outside records available from external health systems.  Review of Systems:  A 10 point review of systems is negative, except for the pertinent positives and negatives detailed in the HPI.  Past Medical History: Past Medical History:  Diagnosis Date   Actinic keratosis    Basal cell carcinoma 02/24/2021   R nose, EDC 03/24/21   Clotting disorder (HCC)    Coronary artery disease    a. 02/2012 PCI ([facility]): OM1 99% (2.5x18 Resolute DES).   ED (erectile dysfunction)    Glaucoma    Hyperlipidemia    Hypertension    Hypotestosteronism    MI (myocardial infarction) (HCC)    Osteoarthritis    Raynaud's disease    Skin cancer years ago   forehead - treated by Dr Jarold Motto   Squamous cell carcinoma of skin 07/13/2022   L lat upper arm, scheduled for [facility]   SVT (supraventricular tachycardia)    Syncope and collapse     Past Surgical History: Past Surgical History:  Procedure Laterality Date   CARDIAC CATHETERIZATION     CERVICAL SPINE SURGERY     CORONARY ANGIOPLASTY WITH STENT PLACEMENT  02/29/2012   [facility] placed the stent 2.5 mm x 18 mm Ref # [id number] LOT # [id number]   lung cyst removed      TONSILLECTOMY     TRACHEAL SURGERY      Allergies: Allergies as of 04/27/2023 - Review Complete 04/27/2023  Allergen Reaction Noted   Cefazolin Diarrhea and Rash 09/07/2015   Clindamycin Diarrhea and Rash 09/07/2015    Medications: Current Meds  Medication Sig   cefdinir (OMNICEF) 300 MG capsule Take 1 capsule (300 mg total) by mouth 2 (two) times daily  for 10 days.   lidocaine (LIDODERM) 5 % Place 1 patch onto the skin every 12 (twelve) hours for 5 days. Remove & Discard patch within 12 hours or as directed by MD    Social History: Social History   Tobacco Use   Smoking status: Former    Current packs/day: 1.00    Average packs/day: 1 pack/day for 10.0 years (10.0 ttl pk-yrs)    Types: Cigarettes   Smokeless tobacco: Never  Vaping Use   Vaping status: Never Used  Substance Use Topics   Alcohol use: No   Drug use: No    Family Medical History: Family History  Problem Relation Age of Onset   Heart attack Mother 26    Physical Examination: Vitals:   04/27/23 0948 04/27/23 1023  BP: (!) 179/165 (!) 170/110  Pulse: 95 90  Resp: 18 18  Temp: 97.9 F (36.6 C)   SpO2: 96% 96%    General: Patient is in no apparent distress. Attention to examination is appropriate.  Neck:   Supple.  Full range of motion.  Respiratory: Patient is breathing without any difficulty.   NEUROLOGICAL:     Awake, alert, oriented to person, place, and time.  Speech is clear and fluent.  Cranial Nerves: Pupils equal round and reactive to light.  Facial  tone is symmetric.  Facial sensation is symmetric. Shoulder shrug is symmetric. Tongue protrusion is midline.  There is no pronator drift.  Strength: Side Biceps Triceps Deltoid Interossei Grip Wrist Ext. Wrist Flex.  R 5 5 5 5 5 5 5   L 5 5 5 5 5 5 5    Side Iliopsoas Quads Hamstring PF DF EHL  R 5 5 5 5 5 5   L 5 5 5 5 5 5    Reflexes are 1+ and symmetric at the biceps, triceps, brachioradialis, patella and achilles.   Hoffman's is absent.   Bilateral upper and lower extremity sensation is intact to light touch.    No evidence of dysmetria noted.  Gait is untested.     Medical Decision Making  Imaging: CT L spine 04/27/2023 IMPRESSION: 1. Acute burst type compression deformity through the L3 vertebral body. 2. Progressive chronic compression deformity of the superior endplate of L1  with progressive height loss compared to 2019. 3. Moderate spinal canal narrowing at L3-L4.   Aortic Atherosclerosis (ICD10-I70.0).     Electronically Signed   By: Lorenza Cambridge M.D.   On: 04/27/2023 12:00    I have personally reviewed the images and agree with the above interpretation.  Assessment and Plan: Patrick Malone is a pleasant 89 y.o. male with L3 burst fracture.  This is a nonoperative fracture.  I recommended an LSO brace.  Will follow him back in clinic.    I have communicated my recommendations to the requesting physician and coordinated care to facilitate these recommendations.     Urvi Imes K. Myer Haff MD, [facility] Neurosurgery

## 2023-04-27 NOTE — ED Notes (Signed)
Pt to CT at this time. CT to bring patient to room 45 when finished.

## 2023-04-27 NOTE — Consult Note (Addendum)
Full note to follow.  89 year old gentleman status post fall.  Has back pain.  CT scan shows L3 burst fracture.  TLICS2.  This is a nonoperative fracture.  Would recommend LSO brace.  Okay to remove brace in bed.  Wear brace when out of bed.  Would recommend admission to hospitalist service if he requires ongoing pain control or PTOT evaluation.

## 2023-04-29 LAB — URINE CULTURE

## 2023-05-03 ENCOUNTER — Other Ambulatory Visit: Payer: Self-pay | Admitting: Orthopedic Surgery

## 2023-05-03 DIAGNOSIS — S32030A Wedge compression fracture of third lumbar vertebra, initial encounter for closed fracture: Secondary | ICD-10-CM

## 2023-05-03 NOTE — Progress Notes (Signed)
Referring Physician:  Venetia Night, MD [address] [place],  [place] [address]  Primary Physician:  Dorothey Baseman, MD  History of Present Illness: 05/16/2023 Patrick Malone has a history of CAD, HTN, cardiac stents, and hyperlipidemia.   Seen by Dr. Myer Haff as hospital consult on 04/27/23 with back pain after a fall on 04/26/23. Found to have L3 burst fracture. Surgery was not recommended. He was placed in LSO brace.   He is here for follow up.   His pain is improving. He has minimal intermittent LBP with no leg pain. He is moving around better. He is still wearing his brace. No numbness, tingling, or new weakness in his legs. He has history of diffuse leg weakness since neck surgery years ago- this is unchanged.   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   He is doing HHPT.   He is taking tylenol and using lidoderm patches.   Bowel/Bladder Dysfunction: none  Past Surgery:  Previous cervical surgery years ago.   Review of Systems:  A 10 point review of systems is negative, except for the pertinent positives and negatives detailed in the HPI.  Past Medical History: Past Medical History:  Diagnosis Date   Actinic keratosis    Basal cell carcinoma 02/24/2021   R nose, EDC 03/24/21   Clotting disorder (HCC)    Coronary artery disease    a. 02/2012 PCI ([facility]): OM1 99% (2.5x18 Resolute DES).   ED (erectile dysfunction)    Glaucoma    Hyperlipidemia    Hypertension    Hypotestosteronism    MI (myocardial infarction) (HCC)    Osteoarthritis    Raynaud's disease    Skin cancer years ago   forehead - treated by Dr Jarold Motto   Squamous cell carcinoma of skin 07/13/2022   L lat upper arm, scheduled for [facility]   SVT (supraventricular tachycardia)    Syncope and collapse     Past Surgical History: Past Surgical History:  Procedure Laterality Date   CARDIAC CATHETERIZATION      CERVICAL SPINE SURGERY     CORONARY ANGIOPLASTY WITH STENT PLACEMENT  02/29/2012   [facility] placed the stent 2.5 mm x 18 mm Ref # [id number] LOT # [id number]   lung cyst removed      TONSILLECTOMY     TRACHEAL SURGERY      Allergies: Allergies as of 05/16/2023 - Review Complete 05/16/2023  Allergen Reaction Noted   Cefazolin Diarrhea and Rash 09/07/2015   Clindamycin Diarrhea and Rash 09/07/2015    Medications: Outpatient Encounter Medications as of 05/16/2023  Medication Sig   aspirin 81 MG chewable tablet Chew 81 mg by mouth daily.    atorvastatin (LIPITOR) 40 MG tablet Take 1 tablet (40 mg total) by mouth daily.   fluorouracil (EFUDEX) 5 % cream Apply topically 2 (two) times daily. Apply for 7 days to scalp and forehead.   lisinopril (ZESTRIL) 10 MG tablet Take 1 tablet (10 mg total) by mouth daily.   Pediatric Multiple Vit-C-FA (CHEWABLE VITE CHILDRENS) CHEW Chew by mouth.   timolol (TIMOPTIC) 0.5 % ophthalmic solution Place 1 drop into both eyes daily.    [EXPIRED] cefdinir (OMNICEF) 300 MG capsule Take 1 capsule (300 mg total) by mouth 2 (two) times daily for 10 days.   No facility-administered encounter medications on file as of 05/16/2023.    Social History: Social History   Tobacco Use   Smoking  status: Former    Current packs/day: 1.00    Average packs/day: 1 pack/day for 10.0 years (10.0 ttl pk-yrs)    Types: Cigarettes   Smokeless tobacco: Never  Vaping Use   Vaping status: Never Used  Substance Use Topics   Alcohol use: No   Drug use: No    Family Medical History: Family History  Problem Relation Age of Onset   Heart attack Mother 28    Physical Examination: Vitals:   05/16/23 1032  BP: 115/72    General: Patient is well developed, well nourished, calm, collected, and in no apparent distress. Attention to examination is appropriate.  Respiratory: Patient is breathing without any difficulty.   NEUROLOGICAL:     Awake, alert, oriented to person,  place, and time.  Speech is clear and fluent. Fund of knowledge is appropriate.   Cranial Nerves: Pupils equal round and reactive to light.  Facial tone is symmetric.    No posterior lumbar tenderness.   No abnormal lesions on exposed skin.   Strength: Side Biceps Triceps Deltoid Interossei Grip Wrist Ext. Wrist Flex.  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   Reflexes are 1+ and symmetric at the biceps, brachioradialis, patella and achilles.    Clonus is not present.   Bilateral upper and lower extremity sensation is intact to light touch.     Gait is not tested. He is in a WC.   Medical Decision Making  Imaging: Lumbar xrays dated 05/16/23:  Some progression of L3 burst fracture. L1 fracture noted as well.   Radiology report not available for above xrays.    CT of lumbar spine dated 04/27/23:  FINDINGS: Segmentation: 6 lumbar-type vertebral with lumbarization. Last well-formed disc space is labeled S1-S2   Alignment: Normal.   Vertebrae: Progressive chronic compression deformity of the superior endplate of L1 with progressive height loss compared to 2019. There is an acute burst type compression deformity through the L3 vertebral body   Paraspinal and other soft tissues: Bibasilar atelectasis. Cholelithiasis without cholecystitis. Aortic atherosclerotic calcifications.   Disc levels: Moderate spinal canal narrowing at L3-L4   IMPRESSION: 1. Acute burst type compression deformity through the L3 vertebral body. 2. Progressive chronic compression deformity of the superior endplate of L1 with progressive height loss compared to 2019. 3. Moderate spinal canal narrowing at L3-L4.   Aortic Atherosclerosis (ICD10-I70.0).     Electronically Signed   By: Lorenza Cambridge M.D.    On: 04/27/2023 12:00  I have personally reviewed the images and agree with the above interpretation.  Assessment and Plan: Patrick Malone is a pleasant 89 y.o. male has older compression fracture at L1 with new L3 burst fracture s/p fall 04/27/23.   He has minimal intermittent LBP with no leg pain. He is moving around better. No numbness, tingling, or new weakness in his legs. He has history of diffuse leg weakness since neck surgery years ago- this is unchanged.   Xrays from today show some progression of L3 fracture. L1 fracture also noted.   Treatment options discussed with patient and his daughter. Following plan made:   - Discussed progression of L3 fracture. He looks  good clinically and pain is improving. No further treatment recommended.  - Continue with LSO brace when up. No bending, twisting, and lifting.  - Will call if he develops severe pain, leg pain, or new numbness/weakness in legs. History of chronic feelings of leg weakness- has good strength on exam.  - Follow up in 6 weeks with repeat xrays. Discussed he would be in brace for 3 months.   I spent a total of 35 minutes in face-to-face and non-face-to-face activities related to this patient's care today including review of outside records, review of imaging, review of symptoms, physical exam, discussion of differential diagnosis, discussion of treatment options, and documentation.   Drake Leach PA-C Dept. of Neurosurgery

## 2023-05-16 ENCOUNTER — Ambulatory Visit
Admission: RE | Admit: 2023-05-16 | Discharge: 2023-05-16 | Disposition: A | Discharge status: Home / Self Care | Payer: Medicare Other | Attending: Orthopedic Surgery | Admitting: Orthopedic Surgery

## 2023-05-16 ENCOUNTER — Ambulatory Visit: Payer: Medicare Other | Admitting: Orthopedic Surgery

## 2023-05-16 ENCOUNTER — Ambulatory Visit
Admission: RE | Admit: 2023-05-16 | Discharge: 2023-05-16 | Disposition: A | Discharge status: Home / Self Care | Payer: Medicare Other | Source: Ambulatory Visit | Attending: Orthopedic Surgery | Admitting: Orthopedic Surgery

## 2023-05-16 ENCOUNTER — Encounter: Payer: Self-pay | Admitting: Orthopedic Surgery

## 2023-05-16 VITALS — BP 115/72 | Ht 72.0 in | Wt 150.0 lb

## 2023-05-16 DIAGNOSIS — W19XXXD Unspecified fall, subsequent encounter: Secondary | ICD-10-CM

## 2023-05-16 DIAGNOSIS — S32030D Wedge compression fracture of third lumbar vertebra, subsequent encounter for fracture with routine healing: Secondary | ICD-10-CM | POA: Diagnosis not present

## 2023-05-16 DIAGNOSIS — S32010A Wedge compression fracture of first lumbar vertebra, initial encounter for closed fracture: Secondary | ICD-10-CM

## 2023-05-16 DIAGNOSIS — S32030A Wedge compression fracture of third lumbar vertebra, initial encounter for closed fracture: Secondary | ICD-10-CM | POA: Insufficient documentation

## 2023-05-16 DIAGNOSIS — S32001D Stable burst fracture of unspecified lumbar vertebra, subsequent encounter for fracture with routine healing: Secondary | ICD-10-CM

## 2023-05-16 DIAGNOSIS — S32010D Wedge compression fracture of first lumbar vertebra, subsequent encounter for fracture with routine healing: Secondary | ICD-10-CM

## 2023-05-16 NOTE — Patient Instructions (Signed)
It was so nice to see you today. Thank you so much for coming in.    You have a compression fracture at L1 that is older and a new fracture at L3. On your xrays, the fracture at L3 is a little worse. This is not uncommon.   No bending, twisting, or lifting. Wear your brace when out of bed.   I will see you back in 6 weeks, you will need to get xrays prior to this visit (like you did today). Please do not hesitate to call if you have any questions or concerns. You can also message me in MyChart.   Drake Leach PA-C [phone]

## 2023-05-17 ENCOUNTER — Encounter: Payer: Self-pay | Admitting: Urology

## 2023-05-17 ENCOUNTER — Ambulatory Visit: Payer: Medicare Other | Admitting: Urology

## 2023-05-17 VITALS — BP 117/74 | HR 83 | Ht 72.0 in | Wt 150.0 lb

## 2023-05-17 DIAGNOSIS — N401 Enlarged prostate with lower urinary tract symptoms: Secondary | ICD-10-CM | POA: Diagnosis not present

## 2023-05-17 DIAGNOSIS — R350 Frequency of micturition: Secondary | ICD-10-CM | POA: Diagnosis not present

## 2023-05-17 DIAGNOSIS — Z87440 Personal history of urinary (tract) infections: Secondary | ICD-10-CM

## 2023-05-17 LAB — URINALYSIS, COMPLETE
Bilirubin, UA: NEGATIVE
Glucose, UA: NEGATIVE
Ketones, UA: NEGATIVE
Nitrite, UA: NEGATIVE
Protein,UA: NEGATIVE
RBC, UA: NEGATIVE
Specific Gravity, UA: 1.015 (ref 1.005–1.030)
Urobilinogen, Ur: 0.2 mg/dL (ref 0.2–1.0)
pH, UA: 5.5 (ref 5.0–7.5)

## 2023-05-17 LAB — MICROSCOPIC EXAMINATION

## 2023-05-17 LAB — BLADDER SCAN AMB NON-IMAGING: PVR: 50 WU

## 2023-05-17 MED ORDER — SILODOSIN 8 MG PO CAPS: 8.0000 mg | ORAL_CAPSULE | Freq: Every day | ORAL | 2 refills | Status: DC

## 2023-05-17 NOTE — Progress Notes (Signed)
I,Dina M Abdulla,acting as a scribe for Riki Altes, MD.,have documented all relevant documentation on the behalf of Riki Altes, MD,as directed by  Riki Altes, MD while in the presence of Riki Altes, MD.  05/17/2023 11:27 AM   [name] 1932/01/30 [id number]  Referring provider: Dorothey Baseman, MD [address] [place],  [place] [address]  Chief Complaint  Patient presents with   Urinary Frequency    HPI: Patrick Malone is a 89 y.o. male referred for urinary frequency.  ED visit 04/06/2023 with complaints of urinary frequency and lower abdominal discomfort associated with weakness and low grade fever. Urinalysis consistent with UTI and he was treated with 1 g Rocephin and a 10 day course of Keflex; urine culture subsequently grew E.coli.  He returned to the ED 04/27/2023, complaining of recurrent symptoms and UA, again with pyuria and urine culture growing pan-sensitive E.coli. He again recieved Rocephin and was treated with a 10 day course of Cefdinir. Symptoms have improved, though he does have bothersome urinary symptoms, including sensation of incomplete emptying, frequency, urgency, and nocturia x4; IPSS today 20/35. He states he saw Dr. Orson Slick several years ago and was on medication for prostate enlargement. Denies dysuria or gross hematuria. No history of sleep apnea.    PMH: Past Medical History:  Diagnosis Date   Actinic keratosis    Basal cell carcinoma 02/24/2021   R nose, EDC 03/24/21   Clotting disorder (HCC)    Coronary artery disease    a. 02/2012 PCI ([facility]): OM1 99% (2.5x18 Resolute DES).   ED (erectile dysfunction)    Glaucoma    Hyperlipidemia    Hypertension    Hypotestosteronism    MI (myocardial infarction) (HCC)    Osteoarthritis    Raynaud's disease    Skin cancer years ago   forehead - treated by Dr Jarold Motto   Squamous cell carcinoma of skin 07/13/2022   L lat upper arm, scheduled for [facility]   SVT (supraventricular tachycardia)     Syncope and collapse     Surgical History: Past Surgical History:  Procedure Laterality Date   CARDIAC CATHETERIZATION     CERVICAL SPINE SURGERY     CORONARY ANGIOPLASTY WITH STENT PLACEMENT  02/29/2012   [facility] placed the stent 2.5 mm x 18 mm Ref # [id number] LOT # [id number]   lung cyst removed      TONSILLECTOMY     TRACHEAL SURGERY      Home Medications:  Allergies as of 05/17/2023       Reactions   Cefazolin Diarrhea, Rash   Clindamycin Diarrhea, Rash        Medication List        Accurate as of May 17, 2023 11:27 AM. If you have any questions, ask your nurse or doctor.          acetaminophen 325 MG tablet Commonly known as: TYLENOL Take 650 mg by mouth every 6 (six) hours as needed.   aspirin 81 MG chewable tablet Chew 81 mg by mouth daily.   atorvastatin 40 MG tablet Commonly known as: LIPITOR Take 1 tablet (40 mg total) by mouth daily.   fluorouracil 5 % cream Commonly known as: EFUDEX Apply topically 2 (two) times daily. Apply for 7 days to scalp and forehead.   lactobacillus acidophilus Tabs tablet Take 2 tablets by mouth 3 (three) times daily.   liquid protein NICU Liqd Take 2 mLs by mouth.   lisinopril 10 MG tablet Commonly  known as: ZESTRIL Take 1 tablet (10 mg total) by mouth daily.   silodosin 8 MG Caps capsule Commonly known as: RAPAFLO Take 1 capsule (8 mg total) by mouth daily with breakfast. Started by: Riki Altes   timolol 0.5 % ophthalmic solution Commonly known as: TIMOPTIC Place 1 drop into both eyes daily.   zinc gluconate 50 MG tablet Take 50 mg by mouth daily.        Allergies:  Allergies  Allergen Reactions   Cefazolin Diarrhea and Rash   Clindamycin Diarrhea and Rash    Family History: Family History  Problem Relation Age of Onset   Heart attack Mother 9    Social History:  reports that he has quit smoking. His smoking use included cigarettes. He has a 10 pack-year smoking history. He has  never used smokeless tobacco. He reports that he does not drink alcohol and does not use drugs.   Physical Exam: BP 117/74   Pulse 83   Ht 6' (1.829 m)   Wt 150 lb (68 kg)   BMI 20.34 kg/m   Constitutional:  Alert and oriented, No acute distress. HEENT: [place] AT, moist mucus membranes.  Trachea midline, no masses. Cardiovascular: No clubbing, cyanosis, or edema. Respiratory: Normal respiratory effort, no increased work of breathing. GI: Abdomen is soft, nontender, nondistended, no abdominal masses Skin: No rashes, bruises or suspicious lesions. Neurologic: Grossly intact, no focal deficits, moving all 4 extremities. Psychiatric: Normal mood and affect.   Laboratory Data:  Urinalysis Trace leukocytes on dipstick/microscopy negative.   Assessment & Plan:    1. BPH with LUTS PVR 50 mL. Start Silodosin 4 mg daily. 1 month follow up for symptom check.  2. History of UTI We discussed UTIs in men, especially with systemic symtoms are commonly prostate infections, which take a 3-4 week course to clear. His second ED visit due to persistent and not recurrent infection. UA today clear, however, instructed to call should he develop recurrent UTI symptoms. Repeat UA on 1 month follow up.      [facility] [address] [place], [place] [address] [phone]

## 2023-05-18 ENCOUNTER — Encounter: Payer: Self-pay | Admitting: Urology

## 2023-05-30 ENCOUNTER — Telehealth: Payer: Self-pay | Admitting: Urology

## 2023-05-30 NOTE — Telephone Encounter (Signed)
Patient's daughter Patrick Malone) called and stated that patient has been taking Silodosin daily with breakfast per the directions. She said that he does not urinate all day, until bedtime. She asked if he can take medication before bedtime instead. Please advise.

## 2023-05-31 ENCOUNTER — Ambulatory Visit: Payer: Medicare Other | Admitting: Internal Medicine

## 2023-05-31 NOTE — Telephone Encounter (Signed)
OK to try before bedtime   Left message on voice mail per [facility]

## 2023-06-16 ENCOUNTER — Ambulatory Visit: Payer: Medicare Other | Admitting: Urology

## 2023-06-16 VITALS — BP 132/75 | HR 91 | Ht 72.0 in | Wt 150.0 lb

## 2023-06-16 DIAGNOSIS — R351 Nocturia: Secondary | ICD-10-CM

## 2023-06-16 DIAGNOSIS — N401 Enlarged prostate with lower urinary tract symptoms: Secondary | ICD-10-CM

## 2023-06-16 LAB — URINALYSIS, COMPLETE
Bilirubin, UA: NEGATIVE
Glucose, UA: NEGATIVE
Ketones, UA: NEGATIVE
Leukocytes,UA: NEGATIVE
Nitrite, UA: NEGATIVE
Protein,UA: NEGATIVE
RBC, UA: NEGATIVE
Specific Gravity, UA: 1.01 (ref 1.005–1.030)
Urobilinogen, Ur: 0.2 mg/dL (ref 0.2–1.0)
pH, UA: 6 (ref 5.0–7.5)

## 2023-06-16 LAB — BLADDER SCAN AMB NON-IMAGING: Scan Result: 71

## 2023-06-16 LAB — MICROSCOPIC EXAMINATION: Bacteria, UA: NONE SEEN

## 2023-06-16 MED ORDER — SILODOSIN 8 MG PO CAPS: 8.0000 mg | ORAL_CAPSULE | Freq: Every day | ORAL | 2 refills | Status: DC

## 2023-06-16 MED ORDER — GEMTESA 75 MG PO TABS: 75.0000 mg | ORAL_TABLET | Freq: Every day | ORAL | Status: DC

## 2023-06-16 NOTE — Progress Notes (Signed)
I, Maysun Anabel Bene, acting as a scribe for Riki Altes, MD., have documented all relevant documentation on the behalf of Riki Altes, MD, as directed by Riki Altes, MD while in the presence of Riki Altes, MD.  06/15/2023 2:17 PM   [name] [birth year] [id number]  Referring provider: Dorothey Baseman, MD [phone] [place],  [place] [address]  Chief Complaint  Patient presents with   Follow-up   Urology history: 1. BPH with LUTS Silodosin 4 mg daily.  2. History of UTI  HPI: Patrick Malone is a 89 y.o. male presents for a 1 month follow-up visit.   Initially seen 05/17/2023 for urinary frequency. Refer to that note for details.  He was given a trial of silodosin and states he saw no improvement in his daytime or nighttime voiding symptoms. His daughter, who is with him today, states they had called to see if he could take the silidosin at nighttime and did see a decrease in his nocturia from 5-6 times per night to 3-4. No dysuria or gross hematuria.   PMH: Past Medical History:  Diagnosis Date   Actinic keratosis    Basal cell carcinoma 02/24/2021   R nose, EDC 03/24/21   Clotting disorder (HCC)    Coronary artery disease    a. 02/2012 PCI ([facility]): OM1 99% (2.5x18 Resolute DES).   ED (erectile dysfunction)    Glaucoma    Hyperlipidemia    Hypertension    Hypotestosteronism    MI (myocardial infarction) (HCC)    Osteoarthritis    Raynaud's disease    Skin cancer years ago   forehead - treated by Dr Jarold Motto   Squamous cell carcinoma of skin 07/13/2022   L lat upper arm, scheduled for [facility]   SVT (supraventricular tachycardia)    Syncope and collapse     Surgical History: Past Surgical History:  Procedure Laterality Date   CARDIAC CATHETERIZATION     CERVICAL SPINE SURGERY     CORONARY ANGIOPLASTY WITH STENT PLACEMENT  02/29/2012   [facility] placed the stent 2.5 mm x 18 mm Ref # [id number] LOT # [id number]   lung cyst removed       TONSILLECTOMY     TRACHEAL SURGERY      Home Medications:  Allergies as of 06/16/2023       Reactions   Cefazolin Diarrhea, Rash   Clindamycin Diarrhea, Rash        Medication List        Accurate as of June 16, 2023  2:17 PM. If you have any questions, ask your nurse or doctor.          acetaminophen 325 MG tablet Commonly known as: TYLENOL Take 650 mg by mouth every 6 (six) hours as needed.   aspirin 81 MG chewable tablet Chew 81 mg by mouth daily.   atorvastatin 40 MG tablet Commonly known as: LIPITOR Take 1 tablet (40 mg total) by mouth daily.   fluorouracil 5 % cream Commonly known as: EFUDEX Apply topically 2 (two) times daily. Apply for 7 days to scalp and forehead.   Gemtesa 75 MG Tabs Generic drug: Vibegron Take 1 tablet (75 mg total) by mouth daily.   lactobacillus acidophilus Tabs tablet Take 2 tablets by mouth 3 (three) times daily.   liquid protein NICU Liqd Take 2 mLs by mouth.   lisinopril 10 MG tablet Commonly known as: ZESTRIL Take 1 tablet (10 mg total) by mouth daily.  silodosin 8 MG Caps capsule Commonly known as: RAPAFLO Take 1 capsule (8 mg total) by mouth daily with breakfast.   timolol 0.5 % ophthalmic solution Commonly known as: TIMOPTIC Place 1 drop into both eyes daily.   zinc gluconate 50 MG tablet Take 50 mg by mouth daily.        Allergies:  Allergies  Allergen Reactions   Cefazolin Diarrhea and Rash   Clindamycin Diarrhea and Rash    Family History: Family History  Problem Relation Age of Onset   Heart attack Mother 91    Social History:  reports that he has quit smoking. His smoking use included cigarettes. He has a 10 pack-year smoking history. He has never used smokeless tobacco. He reports that he does not drink alcohol and does not use drugs.   Physical Exam: BP 132/75   Pulse 91   Ht 6' (1.829 m)   Wt 150 lb (68 kg)   BMI 20.34 kg/m   Constitutional:  Alert and oriented, No acute  distress. HEENT:  AT, moist mucus membranes.  Trachea midline, no masses. Cardiovascular: No clubbing, cyanosis, or edema. Respiratory: Normal respiratory effort, no increased work of breathing. GI: Abdomen is soft, nontender, nondistended, no abdominal masses Skin: No rashes, bruises or suspicious lesions. Neurologic: Grossly intact, no focal deficits, moving all 4 extremities. Psychiatric: Normal mood and affect.   Urinalysis Dipstick/ microscopy negative   Assessment & Plan:    1. BPH with LUTS PVR today 71 mL. Will continue silodosin.  Add Gemtesa 75 mg daily for his storage-related voiding symptoms. 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  PA follow-up in 1 month for reassessment of his symptoms. If his storage-related voiding symptoms have improved and PVR stable, can consider stopping his silidosin.   2. Nocturia We discussed although nocturia could be related to detrusor overactivity. We discussed common causes of nocturia including nocturnal polyuria and sleep apnea.  If nocturia does not improve consider a sleep study by PCP.  [facility] [address] [place], [place] [address] [phone]

## 2023-06-17 ENCOUNTER — Encounter: Payer: Self-pay | Admitting: Urology

## 2023-06-23 ENCOUNTER — Other Ambulatory Visit: Payer: Self-pay | Admitting: Orthopedic Surgery

## 2023-06-23 DIAGNOSIS — S32010A Wedge compression fracture of first lumbar vertebra, initial encounter for closed fracture: Secondary | ICD-10-CM

## 2023-06-23 DIAGNOSIS — S32001D Stable burst fracture of unspecified lumbar vertebra, subsequent encounter for fracture with routine healing: Secondary | ICD-10-CM

## 2023-06-23 NOTE — Progress Notes (Addendum)
Referring Physician:  Dorothey Baseman, MD [phone] [place],  [place] [address]  Primary Physician:  Dorothey Baseman, MD  History of Present Illness: 06/23/2023 Mr. Patrick Malone has a history of CAD, HTN, cardiac stents, and hyperlipidemia.   Last seen by me on 05/16/23 for old L1 compression fracture and new L3 burst fracture s/p fall on 04/27/23.   He was to continue with LSO brace at his last visit. He is here for follow up and repeat xrays.   He feels like his pain is better, but he still has intermittent LBP. No leg pain. He is wearing his brace. Pain is worse when he is in bed at night and turns. His daughter thinks he is doing much better and is more active.   His wife fell yesterday and broke her ankle. She will need surgery in next week or two.   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   Bowel/Bladder Dysfunction: none  Past Surgery:  Previous cervical surgery years ago.   Review of Systems:  A 10 point review of systems is negative, except for the pertinent positives and negatives detailed in the HPI.  Past Medical History: Past Medical History:  Diagnosis Date   Actinic keratosis    Basal cell carcinoma 02/24/2021   R nose, EDC 03/24/21   Clotting disorder (HCC)    Coronary artery disease    a. 02/2012 PCI ([facility]): OM1 99% (2.5x18 Resolute DES).   ED (erectile dysfunction)    Glaucoma    Hyperlipidemia    Hypertension    Hypotestosteronism    MI (myocardial infarction) (HCC)    Osteoarthritis    Raynaud's disease    Skin cancer years ago   forehead - treated by Dr Jarold Motto   Squamous cell carcinoma of skin 07/13/2022   L lat upper arm, scheduled for [facility]   SVT (supraventricular tachycardia)    Syncope and collapse     Past Surgical History: Past Surgical History:  Procedure Laterality Date   CARDIAC CATHETERIZATION     CERVICAL SPINE SURGERY     CORONARY ANGIOPLASTY WITH STENT  PLACEMENT  02/29/2012   [facility] placed the stent 2.5 mm x 18 mm Ref # [id number] LOT # [id number]   lung cyst removed      TONSILLECTOMY     TRACHEAL SURGERY      Allergies: Allergies as of 06/28/2023 - Review Complete 06/17/2023  Allergen Reaction Noted   Cefazolin Diarrhea and Rash 09/07/2015   Clindamycin Diarrhea and Rash 09/07/2015    Medications: Outpatient Encounter Medications as of 06/28/2023  Medication Sig   acetaminophen (TYLENOL) 325 MG tablet Take 650 mg by mouth every 6 (six) hours as needed.   Amino Acids-Protein Hydrolys (LIQUID PROTEIN NICU) LIQD Take 2 mLs by mouth.   aspirin 81 MG chewable tablet Chew 81 mg by mouth daily.    atorvastatin (LIPITOR) 40 MG tablet Take 1 tablet (40 mg total) by mouth daily.   fluorouracil (EFUDEX) 5 % cream Apply topically 2 (two) times daily. Apply for 7 days to scalp and forehead.   lactobacillus acidophilus (BACID) TABS tablet Take 2 tablets by mouth 3 (three) times daily.   lisinopril (ZESTRIL) 10 MG tablet Take 1 tablet (10 mg total) by mouth daily.   silodosin (RAPAFLO) 8 MG CAPS capsule Take 1 capsule (8 mg total) by mouth daily with breakfast.   timolol (TIMOPTIC) 0.5 % ophthalmic solution Place 1  drop into both eyes daily.    Vibegron (GEMTESA) 75 MG TABS Take 1 tablet (75 mg total) by mouth daily.   zinc gluconate 50 MG tablet Take 50 mg by mouth daily.   No facility-administered encounter medications on file as of 06/28/2023.    Social History: Social History   Tobacco Use   Smoking status: Former    Current packs/day: 1.00    Average packs/day: 1 pack/day for 10.0 years (10.0 ttl pk-yrs)    Types: Cigarettes   Smokeless tobacco: Never  Vaping Use   Vaping status: Never Used  Substance Use Topics   Alcohol use: No   Drug use: No    Family Medical History: Family History  Problem Relation Age of Onset   Heart attack Mother 50    Physical Examination: There were no vitals filed for this visit.    Awake,  alert, oriented to person, place, and time.  Speech is clear and fluent. Fund of knowledge is appropriate.   Cranial Nerves: Pupils equal round and reactive to light.  Facial tone is symmetric.    No posterior lumbar tenderness.   No abnormal lesions on exposed skin.   Strength: Side Biceps Triceps Deltoid Interossei Grip Wrist Ext. Wrist Flex.  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   Reflexes are 1+ and symmetric at the biceps, brachioradialis, patella and achilles.    Clonus is not present.   Bilateral upper and lower extremity sensation is intact to light touch.     He ambulates with a walker. He is wearing his LSO brace.   Medical Decision Making  Imaging: Lumbar xrays dated 06/28/23:  L3 burst fracture looks grossly stable. L1 fracture noted as well.   Radiology report not available for above xrays.   Assessment and Plan: Mr. Suleiman is a pleasant 89 y.o. male has older compression fracture at L1 with new L3 burst fracture s/p fall 04/27/23.   His back pain continues to improve. It is only intermittent. He has no leg pain, numbness, tingling, or weakness.   Xrays from today show some L3 fracture is grossly stable L1 fracture also noted.   Clinically, he looks good and his pain is improving.   Treatment options discussed with patient and his daughter. Following plan made:   - Continue with LSO brace when up. No bending, twisting, and lifting.  - Follow up in 4 weeks with repeat xrays (flexion/extension).   Of note, he was seeing [facility] for bilateral skin breakdown on buttocks. Was released last week and daughter is concerned they look worse. Message sent to PCP and she will call [facility] when she gets home to see if they can come back out.   I spent a total of 20 minutes in face-to-face  and non-face-to-face activities related to this patient's care today including review of outside records, review of imaging, review of symptoms, physical exam, discussion of differential diagnosis, discussion of treatment options, and documentation.   ADDENDUM 07/18/23:  Lumbar xrays dated 06/28/23:  FINDINGS: Subtle curvature of the lumbar spine convex right unchanged. Mild diffuse decreased bone mineralization. Mild spondylosis of the lumbar spine with moderate facet arthropathy  over the mid to lower lumbar spine. Stable severe compression fractures of L1 and L3. Mild compression deformity of L2 unchanged. No new compression fractures. No evidence of spondylolisthesis.   IMPRESSION: 1. No acute findings. 2. Stable severe compression fractures of L1 and L3 and mild compression deformity of L2. 3. Mild spondylosis of the lumbar spine with moderate facet arthropathy over the mid to lower lumbar spine.     Electronically Signed   By: Elberta Fortis M.D.   On: 07/17/2023 17:13   Radiologist read above xrays with compression at L2 as well. This looks unchanged from previous xrays on 05/16/23. May have compression there as well, but would not change above plan.   Drake Leach PA-C Dept. of Neurosurgery

## 2023-06-28 ENCOUNTER — Telehealth: Payer: Self-pay | Admitting: Cardiovascular Disease

## 2023-06-28 ENCOUNTER — Encounter: Payer: Self-pay | Admitting: Orthopedic Surgery

## 2023-06-28 ENCOUNTER — Ambulatory Visit
Admission: RE | Admit: 2023-06-28 | Discharge: 2023-06-28 | Disposition: A | Discharge status: Home / Self Care | Payer: Medicare Other | Source: Ambulatory Visit | Attending: Orthopedic Surgery | Admitting: Orthopedic Surgery

## 2023-06-28 ENCOUNTER — Ambulatory Visit: Payer: Medicare Other | Admitting: Orthopedic Surgery

## 2023-06-28 ENCOUNTER — Ambulatory Visit
Admission: RE | Admit: 2023-06-28 | Discharge: 2023-06-28 | Disposition: A | Discharge status: Home / Self Care | Payer: Medicare Other | Attending: Orthopedic Surgery | Admitting: Orthopedic Surgery

## 2023-06-28 VITALS — BP 100/62 | Ht 72.0 in | Wt 150.0 lb

## 2023-06-28 DIAGNOSIS — S32001D Stable burst fracture of unspecified lumbar vertebra, subsequent encounter for fracture with routine healing: Secondary | ICD-10-CM | POA: Diagnosis not present

## 2023-06-28 DIAGNOSIS — S32010A Wedge compression fracture of first lumbar vertebra, initial encounter for closed fracture: Secondary | ICD-10-CM | POA: Insufficient documentation

## 2023-06-28 DIAGNOSIS — W19XXXD Unspecified fall, subsequent encounter: Secondary | ICD-10-CM | POA: Diagnosis not present

## 2023-06-28 DIAGNOSIS — S32010D Wedge compression fracture of first lumbar vertebra, subsequent encounter for fracture with routine healing: Secondary | ICD-10-CM

## 2023-06-28 NOTE — Telephone Encounter (Signed)
Pt's daughter [name] is requesting a callback regarding pt seeing his PCP and advising he stop the Lisinopril but would like to see what Dr. Mariah Milling thinks of it. She'd like to discuss further once called back. Please advise

## 2023-06-30 NOTE — Telephone Encounter (Signed)
Called and spoke with patient. Informed of the following from Dr. Mariah Milling.   Okay to stop the lisinopril Would make sure diet does not have excessive potassium Thx TGollan   Patient verbalizes understanding.

## 2023-07-17 ENCOUNTER — Other Ambulatory Visit: Payer: Self-pay | Admitting: Cardiovascular Disease

## 2023-07-17 ENCOUNTER — Telehealth: Payer: Self-pay | Admitting: Cardiovascular Disease

## 2023-07-17 MED ORDER — ATORVASTATIN CALCIUM 40 MG PO TABS: 40.0000 mg | ORAL_TABLET | Freq: Every day | ORAL | 3 refills | Status: DC

## 2023-07-17 NOTE — Telephone Encounter (Signed)
Disp Refills Start End   atorvastatin (LIPITOR) 40 MG tablet 90 tablet 3 07/17/2023 07/11/2024   Sig - Route: Take 1 tablet (40 mg total) by mouth daily. - Oral   Sent to pharmacy as: atorvastatin (LIPITOR) 40 MG tablet   E-Prescribing Status: Receipt confirmed by pharmacy (07/17/2023 10:20 AM EDT)    Pharmacy  [facility] - [place], [place] - [address]

## 2023-07-17 NOTE — Telephone Encounter (Signed)
Please review

## 2023-07-17 NOTE — Telephone Encounter (Signed)
*  STAT* If patient is at the pharmacy, call can be transferred to refill team.   1. Which medications need to be refilled? (please list name of each medication and dose if known)   atorvastatin (LIPITOR) 40 MG tablet (Expired)    2. Which pharmacy/location (including street and city if local pharmacy) is medication to be sent to?  [facility] - [place],  - [address]      3. Do they need a 30 day or 90 day supply? 90 day    Pt is out of medication

## 2023-07-18 ENCOUNTER — Ambulatory Visit (INDEPENDENT_AMBULATORY_CARE_PROVIDER_SITE_OTHER): Payer: Medicare Other | Admitting: Physician Assistant

## 2023-07-18 VITALS — BP 88/59 | HR 112 | Ht 72.0 in | Wt 151.0 lb

## 2023-07-18 DIAGNOSIS — N401 Enlarged prostate with lower urinary tract symptoms: Secondary | ICD-10-CM | POA: Diagnosis not present

## 2023-07-18 LAB — BLADDER SCAN AMB NON-IMAGING: Scan Result: 9

## 2023-07-18 MED ORDER — GEMTESA 75 MG PO TABS
75.0000 mg | ORAL_TABLET | Freq: Every day | ORAL | 11 refills | Status: DC
Start: 2023-07-18 — End: 2023-07-29

## 2023-07-18 NOTE — Progress Notes (Signed)
07/18/2023 4:56 PM   [name] Apr 02, 1932 [id number]  CC: Chief Complaint  Patient presents with   Benign Prostatic Hypertrophy   HPI: Patrick Malone is a 89 y.o. male with PMH urinary frequency with nocturia which improved with nighttime silodosin who presents today for symptom recheck on Gemtesa.  He is accompanied today by his daughter, who contributes to HPI.  Today he reports he ran out of both silodosin and Gemtesa within the past week.  He has not noticed a change in his urinary symptoms.  His daughter reports she has noticed a difference with decreased frequency, dribbling, and incontinence episodes.  Upon hearing this, the patient agrees that it is accurate.  PVR 9 mL.  PMH: Past Medical History:  Diagnosis Date   Actinic keratosis    Basal cell carcinoma 02/24/2021   R nose, [facility]C 03/24/21   Clotting disorder (HCC)    Coronary artery disease    a. 02/2012 PCI ([facility]): OM1 99% (2.5x18 Resolute DES).   ED (erectile dysfunction)    Glaucoma    Hyperlipidemia    Hypertension    Hypotestosteronism    MI (myocardial infarction) (HCC)    Osteoarthritis    Raynaud's disease    Skin cancer years ago   forehead - treated by Dr Jarold Motto   Squamous cell carcinoma of skin 07/13/2022   L lat upper arm, scheduled for [facility]   SVT (supraventricular tachycardia)    Syncope and collapse     Surgical History: Past Surgical History:  Procedure Laterality Date   CARDIAC CATHETERIZATION     CERVICAL SPINE SURGERY     CORONARY ANGIOPLASTY WITH STENT PLACEMENT  02/29/2012   [facility] placed the stent 2.5 mm x 18 mm Ref # [id number] LOT # [id number]   lung cyst removed      TONSILLECTOMY     TRACHEAL SURGERY      Home Medications:  Allergies as of 07/18/2023       Reactions   Cefazolin Diarrhea, Rash   Clindamycin Diarrhea, Rash        Medication List        Accurate as of July 18, 2023  4:56 PM. If you have any questions, ask your nurse or doctor.           STOP taking these medications    silodosin 8 MG Caps capsule Commonly known as: RAPAFLO       TAKE these medications    acetaminophen 325 MG tablet Commonly known as: TYLENOL Take 650 mg by mouth every 6 (six) hours as needed.   aspirin 81 MG chewable tablet Chew 81 mg by mouth daily.   atorvastatin 40 MG tablet Commonly known as: LIPITOR Take 1 tablet (40 mg total) by mouth daily.   fluorouracil 5 % cream Commonly known as: EFUDEX Apply topically 2 (two) times daily. Apply for 7 days to scalp and forehead.   Gemtesa 75 MG Tabs Generic drug: Vibegron Take 1 tablet (75 mg total) by mouth daily.   lactobacillus acidophilus Tabs tablet Take 2 tablets by mouth 3 (three) times daily.   liquid protein NICU Liqd Take 2 mLs by mouth.   lisinopril 10 MG tablet Commonly known as: ZESTRIL Take 1 tablet (10 mg total) by mouth daily.   timolol 0.5 % ophthalmic solution Commonly known as: TIMOPTIC Place 1 drop into both eyes daily.   zinc gluconate 50 MG tablet Take 50 mg by mouth daily.        Allergies:  Allergies  Allergen Reactions   Cefazolin Diarrhea and Rash   Clindamycin Diarrhea and Rash    Family History: Family History  Problem Relation Age of Onset   Heart attack Mother 84    Social History:   reports that he has quit smoking. His smoking use included cigarettes. He has a 10 pack-year smoking history. He has never used smokeless tobacco. He reports that he does not drink alcohol and does not use drugs.  Physical Exam: BP (!) 88/59   Pulse (!) 112   Ht 6' (1.829 m)   Wt 151 lb (68.5 kg)   BMI 20.48 kg/m   Constitutional:  Alert and oriented, no acute distress, nontoxic appearing HEENT: [place], AT Cardiovascular: No clubbing, cyanosis, or edema Respiratory: Normal respiratory effort, no increased work of breathing Skin: No rashes, bruises or suspicious lesions Neurologic: Grossly intact, no focal deficits, moving all 4  extremities Psychiatric: Normal mood and affect  Laboratory Data: Results for orders placed or performed in visit on 07/18/23  Bladder Scan (Post Void Residual) in office  Result Value Ref Range   Scan Result 9    Assessment & Plan:   1. Benign prostatic hyperplasia with lower urinary tract symptoms, symptom details unspecified Improved frequency, urgency, and urge incontinence on Gemtesa.  Will continue this.  He has not noticed a change in his symptoms on silodosin, so we will discontinue it.  If he develops new obstructive symptoms in the coming days, will resume silodosin. - Bladder Scan (Post Void Residual) in office - Vibegron (GEMTESA) 75 MG TABS; Take 1 tablet (75 mg total) by mouth daily.  Dispense: 30 tablet; Refill: 11  Return in about 6 months (around 01/16/2024) for Symptom recheck with PVR.  Carman Ching, PA-C  [facility] [address] [place], [place] [address] [phone]

## 2023-07-19 ENCOUNTER — Telehealth: Payer: Self-pay | Admitting: Orthopedic Surgery

## 2023-07-19 ENCOUNTER — Other Ambulatory Visit: Payer: Self-pay

## 2023-07-19 ENCOUNTER — Emergency Department
Admission: EM | Admit: 2023-07-19 | Discharge: 2023-07-19 | Disposition: A | Discharge status: Home / Self Care | Payer: Medicare Other | Attending: Emergency Medicine | Admitting: Emergency Medicine

## 2023-07-19 ENCOUNTER — Encounter: Payer: Self-pay | Admitting: Emergency Medicine

## 2023-07-19 ENCOUNTER — Emergency Department: Payer: Medicare Other

## 2023-07-19 DIAGNOSIS — M545 Low back pain, unspecified: Secondary | ICD-10-CM | POA: Diagnosis not present

## 2023-07-19 DIAGNOSIS — M25551 Pain in right hip: Secondary | ICD-10-CM | POA: Diagnosis not present

## 2023-07-19 DIAGNOSIS — M549 Dorsalgia, unspecified: Secondary | ICD-10-CM | POA: Diagnosis present

## 2023-07-19 DIAGNOSIS — M25552 Pain in left hip: Secondary | ICD-10-CM | POA: Diagnosis not present

## 2023-07-19 DIAGNOSIS — R531 Weakness: Secondary | ICD-10-CM | POA: Insufficient documentation

## 2023-07-19 LAB — BASIC METABOLIC PANEL
Anion gap: 11 (ref 5–15)
BUN: 31 mg/dL — ABNORMAL HIGH (ref 8–23)
CO2: 24 mmol/L (ref 22–32)
Calcium: 8.6 mg/dL — ABNORMAL LOW (ref 8.9–10.3)
Chloride: 100 mmol/L (ref 98–111)
Creatinine, Ser: 1.03 mg/dL (ref 0.61–1.24)
GFR, Estimated: >60 mL/min (ref >60)
Glucose, Bld: 110 mg/dL — ABNORMAL HIGH (ref 70–99)
Potassium: 4.1 mmol/L (ref 3.5–5.1)
Sodium: 135 mmol/L (ref 135–145)

## 2023-07-19 LAB — URINALYSIS, ROUTINE W REFLEX MICROSCOPIC
Bacteria, UA: NONE SEEN
Bilirubin Urine: NEGATIVE
Glucose, UA: NEGATIVE mg/dL
Hgb urine dipstick: NEGATIVE
Ketones, ur: NEGATIVE mg/dL
Leukocytes,Ua: NEGATIVE
Nitrite: NEGATIVE
Protein, ur: 30 mg/dL — AB
Specific Gravity, Urine: 1.025 (ref 1.005–1.030)
Squamous Epithelial / HPF: 0 /[HPF] (ref 0–5)
pH: 5 (ref 5.0–8.0)

## 2023-07-19 LAB — CBC
HCT: 38.2 % — ABNORMAL LOW (ref 39.0–52.0)
Hemoglobin: 12 g/dL — ABNORMAL LOW (ref 13.0–17.0)
MCH: 28.8 pg (ref 26.0–34.0)
MCHC: 31.4 g/dL (ref 30.0–36.0)
MCV: 91.8 fL (ref 80.0–100.0)
Platelets: 222 10*3/uL (ref 150–400)
RBC: 4.16 MIL/uL — ABNORMAL LOW (ref 4.22–5.81)
RDW: 15.4 % (ref 11.5–15.5)
WBC: 8.1 10*3/uL (ref 4.0–10.5)
nRBC: 0 % (ref 0.0–0.2)

## 2023-07-19 LAB — LACTIC ACID, PLASMA: Lactic Acid, Venous: 1.7 mmol/L (ref 0.5–1.9)

## 2023-07-19 LAB — CBG MONITORING, ED: Glucose-Capillary: 93 mg/dL (ref 70–99)

## 2023-07-19 MED ORDER — OXYCODONE-ACETAMINOPHEN 5-325 MG PO TABS
1.0000 | ORAL_TABLET | Freq: Once | ORAL | Status: AC
  Administered 2023-07-19: 1 via ORAL
  Filled 2023-07-19: qty 1

## 2023-07-19 MED ORDER — FENTANYL CITRATE PF 50 MCG/ML IJ SOSY
50.0000 ug | PREFILLED_SYRINGE | Freq: Once | INTRAMUSCULAR | Status: AC
  Administered 2023-07-19: 50 ug via INTRAVENOUS
  Filled 2023-07-19: qty 1

## 2023-07-19 MED ORDER — OXYCODONE-ACETAMINOPHEN 5-325 MG PO TABS: 1.0000 | ORAL_TABLET | Freq: Four times a day (QID) | ORAL | 0 refills | Status: DC | PRN

## 2023-07-19 NOTE — Telephone Encounter (Signed)
Left message to return call again. ?

## 2023-07-19 NOTE — Telephone Encounter (Signed)
Patient's daughter, [name] is calling to let our office know that her father is in a lot of pain and is barely able to walk. She states that on Monday he was having a blood sugar spell and the pain began in his back and hips and has not subsided. She would like to know what to do to help with his pain. Please advise.

## 2023-07-19 NOTE — Telephone Encounter (Signed)
He had no weakness at his last visit.

## 2023-07-19 NOTE — ED Notes (Signed)
Pt ambulated with walker around the room. Pt and family feel safe for pt to be DC home.

## 2023-07-19 NOTE — Telephone Encounter (Signed)
Can we please get more information?   Is it mostly back pain or back and leg pain, what leg and where?   What makes pain better worse?   Message says he can't walk- is this due to pain or weakness or both?   Any bowel or bladder issues?   What is he taking for pain?   Is he still wearing his brace?

## 2023-07-19 NOTE — ED Provider Notes (Signed)
[facility] Provider Note    Event Date/Time   First MD Initiated Contact with Patient 07/19/23 1701     (approximate)   History   Back Pain and Weakness   HPI  Patrick Malone is a 89 y.o. male   who presents to the emergency department today accompanied by daughter because of concerns for back pain.  The patient did have a compression fracture of his lumbar spine that occurred a couple of months ago.  He has been seen outpatient providers and states that the pain has gotten better.  However on Monday the pain got worse again. It is severe. He is now feeling pain in his bilateral hips and groin. Also having some leg weakness. Denies any change to bowel or bladder. Only trauma he can think of is that he might have sat down harder on his commode on Monday when he was feeling weak. At the time of my exam the patient actually states he feels a little bit better lying on the stretcher.       Physical Exam   Triage Vital Signs: ED Triage Vitals  Encounter Vitals Group     BP 07/19/23 1628 122/61     Systolic BP Percentile --      Diastolic BP Percentile --      Pulse Rate 07/19/23 1628 (!) 119     Resp 07/19/23 1628 18     Temp 07/19/23 1628 99.1 F (37.3 C)     Temp src --      SpO2 07/19/23 1628 97 %     Weight 07/19/23 1712 157 lb (71.2 kg)     Height 07/19/23 1712 6' (1.829 m)     Head Circumference --      Peak Flow --      Pain Score 07/19/23 1629 9     Pain Loc --      Pain Education --      Exclude from Growth Chart --     Most recent vital signs: Vitals:   07/19/23 1628  BP: 122/61  Pulse: (!) 119  Resp: 18  Temp: 99.1 F (37.3 C)  SpO2: 97%     General: Awake, alert, oriented. CV:  Good peripheral perfusion. Regular rate and rhythm. Resp:  Normal effort.  Abd:  No distention.    ED Results / Procedures / Treatments   Labs (all labs ordered are listed, but only abnormal results are displayed) Labs Reviewed  BASIC  METABOLIC PANEL - Abnormal; Notable for the following components:      Result Value   Glucose, Bld 110 (*)    BUN 31 (*)    Calcium 8.6 (*)    All other components within normal limits  CBC - Abnormal; Notable for the following components:   RBC 4.16 (*)    Hemoglobin 12.0 (*)    HCT 38.2 (*)    All other components within normal limits  URINALYSIS, ROUTINE W REFLEX MICROSCOPIC - Abnormal; Notable for the following components:   Color, Urine YELLOW (*)    APPearance HAZY (*)    Protein, ur 30 (*)    All other components within normal limits  LACTIC ACID, PLASMA  CBG MONITORING, ED     EKG  I, Phineas Semen, attending physician, personally viewed and interpreted this EKG  EKG Time: 1640 Rate: 118 Rhythm: sinus tachycardia Axis: normal Intervals: qtc 409 QRS: narrow, q waves v1 ST changes: no st elevation Impression: abnormal ekg  RADIOLOGY I independently interpreted and visualized the MR lumbar spine. My interpretation: no cord compression Radiology interpretation:  IMPRESSION:  1. Chronic compression fractures of L1 and L3. No acute fracture.  2. Mild-to-moderate spinal canal stenosis at the posterosuperior  aspect of L3.  3. L4-L5 mild bilateral neural foraminal narrowing.  4. Narrowing of the lateral recesses at L2-L3, L4-L5, and L5-S1,  which could affect the descending L3, L5, and S1 nerve roots,  respectively.  5. Multilevel facet arthropathy, which can be a cause of back pain.  6. Transitional anatomy, with lumbarization of S1. Please correlate  with imaging if any intervention is planned.      PROCEDURES:  Critical Care performed: No   MEDICATIONS ORDERED IN ED: Medications - No data to display   IMPRESSION / MDM / ASSESSMENT AND PLAN / ED COURSE  I reviewed the triage vital signs and the nursing notes.                              Differential diagnosis includes, but is not limited to, compression fracture, cauda equina  Patient's  presentation is most consistent with acute presentation with potential threat to life or bodily function.   The patient is on the cardiac monitor to evaluate for evidence of arrhythmia and/or significant heart rate changes.  Patient presents to the emergency department today because of concerns for worsening back pain.  Also is having pain and weakness that is starting to go down his legs.  Has history of compression fracture.  Will obtain MRI to evaluate for cord compression. Patient was initially tachycardic for triage however by the time of my exam patient's rate was normal. Think it might have been related to pain, will continue to monitor.   MRI did not show any cord compression or cauda equina.  Did not show any acute fracture.  Did show some narrowing of the lateral recesses.  I do think this could explain some of the patient's symptoms.  I discussed this with the patient.  Do think pain can be controlled here in the emergency department would be reasonable for patient be discharged to continue follow-up with neurosurgery.      FINAL CLINICAL IMPRESSION(S) / ED DIAGNOSES   Final diagnoses:  Acute low back pain, unspecified back pain laterality, unspecified whether sciatica present     Rx / DC Orders   ED Discharge Orders     None        Note:  This document was prepared using Dragon voice recognition software and may include unintentional dictation errors.    Phineas Semen, MD 07/19/23 [phone]

## 2023-07-19 NOTE — Telephone Encounter (Signed)
Also, did he have a new injury or fall?

## 2023-07-19 NOTE — Discharge Instructions (Addendum)
Please seek medical attention for any high fevers, chest pain, shortness of breath, change in behavior, persistent vomiting, bloody stool or any other new or concerning symptoms.  

## 2023-07-19 NOTE — ED Triage Notes (Signed)
Pt here with daughter for increasing back pain and leg weakness. Recommended to come in by PCP. Feet are swollen which started earlier this summer per pt. Took Tylenol this afternoon. Has productive cough.

## 2023-07-19 NOTE — Telephone Encounter (Signed)
I spoke with Patrick Malone daughter [name] and advised that if he is having new weakness, [name] advises they proceed to the ER for evaluation. She will try to find someone to sit with her mom so she can get him to the ER ASAP.

## 2023-07-19 NOTE — Telephone Encounter (Signed)
I spoke with Patrick Malone  They just left his PCP's office. His PCP felt the "spell" the other day was an SVT (that he has a history of). Dr Terance Hart prescribed him some more lidocaine patches at his appointment today. He did not have a new injury or fall. Monday night he was not able to get comfortable due to the pain. They had to use a wheelchair at his appointments yesterday because of pain and weakness. The pain worsened Monday and the weakness started Monday. He is saying it is in his lower back, and bilateral groin (yesterday it was it in his hips as well, but not today). She made him put his back brace on last night and it seems to help some.  Laying down makes it worse. Tylenol isn't helping. No new issues with bowel or bladder.   He is taking tylenol and they tried one lidocaine patch (Dr Mcarthur Rossetti office sent in another rx today that they are going to pick up when ready). He used a walker to go to his appointment with his PCP today. It seems worse at night.

## 2023-07-19 NOTE — Telephone Encounter (Signed)
Left message to return call 

## 2023-07-19 NOTE — Telephone Encounter (Signed)
If he is having new weakness in his legs, then I he needs to go to the emergency room for evaluation. It sounds like this is the case.   If they refuse to go to ED then we can order a lumbar xray and I can see him Friday.   Let me know.

## 2023-07-20 NOTE — Telephone Encounter (Signed)
Called and spoke with [name] (his daughter). He is still having pain but is doing a little better. He took a Microbiologist earlier and was sleeping.   Lumbar MRI dated 07/19/23:  FINDINGS: Segmentation: Lumbarization of S1. The last fully formed disc space is labeled S1-S2, in keeping with the prior CT.   Alignment:  No significant listhesis.   Vertebrae: Redemonstrated chronic compression fractures of L1 and L3, with approximately 60% height loss L1 a in 70% height loss of L3. 3 mm retropulsion of posterosuperior cortex of L1 and 5 mm retropulsion of the posterosuperior cortex of L3. No evidence of acute fracture, discitis, or suspicious osseous lesion.   Conus medullaris and cauda equina: Conus extends to the L2 level. Conus and cauda equina appear normal.   Paraspinal and other soft tissues: No acute finding.   Disc levels:   T12-L1: Not fully included in the field of view.   L1-L2: Seen only on the sagittal images. Minimal disc bulge. No spinal canal stenosis or neural foraminal narrowing.   L2-L3: Minimal disc bulge. Mild facet arthropathy. Narrowing of the lateral recesses. Retropulsion of the central posterosuperior cortex of L3. Mild-to-moderate spinal canal stenosis at the level of the retropulsion. No neural foraminal narrowing.   L3-L4: Minimal disc bulge. Moderate facet arthropathy. No spinal canal stenosis or neural foraminal narrowing.   L4-L5: Mild disc bulge. Moderate facet arthropathy. Narrowing of the lateral recesses. No spinal canal stenosis. Mild bilateral neural foraminal narrowing.   L5-S1: Mild disc bulge. Moderate facet arthropathy. Ligamentum flavum hypertrophy. Narrowing of the lateral recesses. No spinal canal stenosis. No neural foraminal narrowing.   S1-S2: No significant disc bulge. No spinal canal stenosis or foraminal narrowing.   IMPRESSION: 1. Chronic compression fractures of L1 and L3. No acute fracture. 2. Mild-to-moderate spinal canal  stenosis at the posterosuperior aspect of L3. 3. L4-L5 mild bilateral neural foraminal narrowing. 4. Narrowing of the lateral recesses at L2-L3, L4-L5, and L5-S1, which could affect the descending L3, L5, and S1 nerve roots, respectively. 5. Multilevel facet arthropathy, which can be a cause of back pain. 6. Transitional anatomy, with lumbarization of S1. Please correlate with imaging if any intervention is planned.     Electronically Signed   By: Wiliam Ke M.D.   On: 07/19/2023 22:32  His pain may be due to flare up of underlying spondylosis.   He will keep his f/u with me on Wednesday. Will hold on lumbar flex/ext xrays prior to visit.   She will call back with any further questions/concerns.

## 2023-07-20 NOTE — Telephone Encounter (Signed)
[name] pt's daughter was notified.

## 2023-07-20 NOTE — Telephone Encounter (Signed)
Per discussion with [name], she would like them to keep the appointment next week, but they can skip the xrays for the this visit since he had the MRI last night.

## 2023-07-21 NOTE — Progress Notes (Unsigned)
Referring Physician:  Dorothey Baseman, MD [phone] [place],  [place] [address]  Primary Physician:  Dorothey Baseman, MD  History of Present Illness: 07/21/2023 Mr. Patrick Malone has a history of CAD, HTN, cardiac stents, and hyperlipidemia.   Last seen by me on 06/28/23 for old L1 compression fracture and new L3 burst fracture s/p fall on 04/27/23.   He was to continue with LSO brace at his last visit. He was seen in ED On 07/19/23 after having severe back with weakness in his legs. Lumbar MRI showed no worrisome compression. He was sent home with percocet.   He is here for follow up.   He has constant LBP with pain into his bilateral groin. No leg pain. No numbness or tingling. He has overall weakness. He is having difficulty walking due to pain. Feels like he can't lift his legs. He is having trouble sleeping, he has to lean forward and elevate the legs.   Has been seeing urology- thinks he may have some urinary frequency.   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   Bowel/Bladder Dysfunction: none  Past Surgery:  Previous cervical surgery years ago.   Review of Systems:  A 10 point review of systems is negative, except for the pertinent positives and negatives detailed in the HPI.  Past Medical History: Past Medical History:  Diagnosis Date   Actinic keratosis    Basal cell carcinoma 02/24/2021   R nose, EDC 03/24/21   Clotting disorder (HCC)    Coronary artery disease    a. 02/2012 PCI ([facility]): OM1 99% (2.5x18 Resolute DES).   ED (erectile dysfunction)    Glaucoma    Hyperlipidemia    Hypertension    Hypotestosteronism    MI (myocardial infarction) (HCC)    Osteoarthritis    Raynaud's disease    Skin cancer years ago   forehead - treated by Dr Jarold Motto   Squamous cell carcinoma of skin 07/13/2022   L lat upper arm, scheduled for [facility]   SVT (supraventricular tachycardia) (HCC)    Syncope and  collapse     Past Surgical History: Past Surgical History:  Procedure Laterality Date   CARDIAC CATHETERIZATION     CERVICAL SPINE SURGERY     CORONARY ANGIOPLASTY WITH STENT PLACEMENT  02/29/2012   [facility] placed the stent 2.5 mm x 18 mm Ref # [id number] LOT # [id number]   lung cyst removed      TONSILLECTOMY     TRACHEAL SURGERY      Allergies: Allergies as of 07/26/2023 - Review Complete 07/19/2023  Allergen Reaction Noted   Cefazolin Diarrhea and Rash 09/07/2015   Clindamycin Diarrhea and Rash 09/07/2015    Medications: Outpatient Encounter Medications as of 07/26/2023  Medication Sig   acetaminophen (TYLENOL) 325 MG tablet Take 650 mg by mouth every 6 (six) hours as needed.   Amino Acids-Protein Hydrolys (LIQUID PROTEIN NICU) LIQD Take 2 mLs by mouth.   aspirin 81 MG chewable tablet Chew 81 mg by mouth daily.    atorvastatin (LIPITOR) 40 MG tablet Take 1 tablet (40 mg total) by mouth daily.   fluorouracil (EFUDEX) 5 % cream Apply topically 2 (two) times daily. Apply for 7 days to scalp and forehead.   lactobacillus acidophilus (BACID) TABS tablet Take 2 tablets by mouth 3 (three) times daily.   lisinopril (ZESTRIL) 10 MG tablet Take 1 tablet (10 mg total) by mouth daily.  oxyCODONE-acetaminophen (PERCOCET) 5-325 MG tablet Take 1 tablet by mouth every 6 (six) hours as needed for severe pain (pain score 7-10).   timolol (TIMOPTIC) 0.5 % ophthalmic solution Place 1 drop into both eyes daily.    Vibegron (GEMTESA) 75 MG TABS Take 1 tablet (75 mg total) by mouth daily.   zinc gluconate 50 MG tablet Take 50 mg by mouth daily.   No facility-administered encounter medications on file as of 07/26/2023.    Social History: Social History   Tobacco Use   Smoking status: Former    Current packs/day: 1.00    Average packs/day: 1 pack/day for 10.0 years (10.0 ttl pk-yrs)    Types: Cigarettes   Smokeless tobacco: Never  Vaping Use   Vaping status: Never Used  Substance Use  Topics   Alcohol use: No   Drug use: No    Family Medical History: Family History  Problem Relation Age of Onset   Heart attack Mother 72    Physical Examination: There were no vitals filed for this visit.    Awake, alert, oriented to person, place, and time.  Speech is clear and fluent. Fund of knowledge is appropriate.   Cranial Nerves: Pupils equal round and reactive to light.  Facial tone is symmetric.    No posterior lumbar tenderness.   No abnormal lesions on exposed skin.   Strength: Side Biceps Triceps Deltoid Interossei Grip Wrist Ext. Wrist Flex.  R 5 5 5 5 5 5 5   L --- --- --- 5 5 5 5    Side Iliopsoas Quads Hamstring PF DF EHL  R 3 4+ 5 4+ 4+ 4+  L 3 3 5  4+ 4+ 4+   He gives poor effort with lower extremity strength testing.   As above, he has history of old injury to left shoulder with nerve damage- he has limited use of shoulder and elbow.   Reflexes are 1+ and symmetric at the biceps, brachioradialis, patella and achilles.    Clonus is not present.   Bilateral upper and lower extremity sensation is intact to light touch.     He is in a WC. He is wearing his LSO brace.   Medical Decision Making  Imaging: Lumbar MRI dated 07/19/23:  FINDINGS: Segmentation: Lumbarization of S1. The last fully formed disc space is labeled S1-S2, in keeping with the prior CT.   Alignment:  No significant listhesis.   Vertebrae: Redemonstrated chronic compression fractures of L1 and L3, with approximately 60% height loss L1 a in 70% height loss of L3. 3 mm retropulsion of posterosuperior cortex of L1 and 5 mm retropulsion of the posterosuperior cortex of L3. No evidence of acute fracture, discitis, or suspicious osseous lesion.   Conus medullaris and cauda equina: Conus extends to the L2 level. Conus and cauda equina appear normal.   Paraspinal and other soft tissues: No acute finding.   Disc levels:   T12-L1: Not fully included in the field of view.   L1-L2:  Seen only on the sagittal images. Minimal disc bulge. No spinal canal stenosis or neural foraminal narrowing.   L2-L3: Minimal disc bulge. Mild facet arthropathy. Narrowing of the lateral recesses. Retropulsion of the central posterosuperior cortex of L3. Mild-to-moderate spinal canal stenosis at the level of the retropulsion. No neural foraminal narrowing.   L3-L4: Minimal disc bulge. Moderate facet arthropathy. No spinal canal stenosis or neural foraminal narrowing.   L4-L5: Mild disc bulge. Moderate facet arthropathy. Narrowing of the lateral recesses. No spinal canal stenosis.  Mild bilateral neural foraminal narrowing.   L5-S1: Mild disc bulge. Moderate facet arthropathy. Ligamentum flavum hypertrophy. Narrowing of the lateral recesses. No spinal canal stenosis. No neural foraminal narrowing.   S1-S2: No significant disc bulge. No spinal canal stenosis or foraminal narrowing.   IMPRESSION: 1. Chronic compression fractures of L1 and L3. No acute fracture. 2. Mild-to-moderate spinal canal stenosis at the posterosuperior aspect of L3. 3. L4-L5 mild bilateral neural foraminal narrowing. 4. Narrowing of the lateral recesses at L2-L3, L4-L5, and L5-S1, which could affect the descending L3, L5, and S1 nerve roots, respectively. 5. Multilevel facet arthropathy, which can be a cause of back pain. 6. Transitional anatomy, with lumbarization of S1. Please correlate with imaging if any intervention is planned.     Electronically Signed   By: Wiliam Ke M.D.   On: 07/19/2023 22:32  I have personally reviewed the images and agree with the above interpretation.   Assessment and Plan: Mr. Droll is a pleasant 89 y.o. male has older compression fracture at L1 with new L3 burst fracture s/p fall 04/27/23.   He started last week with constant LBP with pain into his bilateral groin. No leg pain. No numbness or tingling. He has overall weakness. He is having difficulty walking due to  pain. Feels like he can't lift his legs.   MRI done last week shows no worrisome compression.   He looks much worse than he did at his last visit with me. He has overall weakness in lower extremities with poor effort (see above).   Patient reviewed with Dr. Katrinka Blazing and he reviewed his imaging.   Treatment options discussed with patient and his daughter. Following plan made:   - His current decline in function and increasing pain is concerning. He is afebrile now, but had temp of 101 on Saturday. He does not some urinary frequency. Has been seeing urology.  - He was sent from office for lumbar flex/ext xrays and full length scoliosis xrays. Lumbar xrays show no gross instability. Scoliosis xrays were not done. These were reviewed with Dr. Katrinka Blazing.   - Dr. Katrinka Blazing recommends he to [facility] ED for further medical evaluation (UA, labs). His mobility is limited and family is having hard time taking care of him at home.  - Dr. Katrinka Blazing also recommends MRI of his cervical, thoracic, and lumbar spine with and without contrast to rule out any epidural abscess/infection.  - In addition to MRIs, cervical and thoracic xrays are recommended as well.   - I called and discussed above with his daughter Patrick Malone. She will bring him to [facility] ED.   I spent a total of 40 minutes in face-to-face and non-face-to-face activities related to this patient's care today including review of outside records, review of imaging, review of symptoms, physical exam, discussion of differential diagnosis, discussion of treatment options, and documentation.   Drake Leach PA-C Dept. of Neurosurgery

## 2023-07-25 ENCOUNTER — Telehealth: Payer: Self-pay

## 2023-07-25 NOTE — Telephone Encounter (Signed)
Unfortunately I am not in clinic tomorrow to review his UA results, and I don't think he should wait until late this week. I'd recommend ED evaluation, especially in light of his fevers.

## 2023-07-25 NOTE — Telephone Encounter (Signed)
Pt's daughter was informed to take pt to ED due to his fever, pt voiced understanding.

## 2023-07-25 NOTE — Telephone Encounter (Signed)
Susan-patient's daughter, called. Patient was seen on 07/18/23. He ended up at ER on 07/19/23 due to back pain issues and recovering from lumbar fracture. She thinks patient has UTI, his symptoms are back pain, groin pain, urine looks like "tea," fever since yesterday 100, 101, urgency to urinate, decreased appetite, mumbling things.  [name] is asking if she can bring a UA sample in for the patient without making him come in because he is not able to ambulate at this time. He is having hard time walking and picking up his feet, he just does not feel well. He feels worse since ER visit. [name] is afraid to miss a UTI and get even worse.

## 2023-07-26 ENCOUNTER — Emergency Department: Payer: Medicare Other

## 2023-07-26 ENCOUNTER — Encounter: Payer: Self-pay | Admitting: *Deleted

## 2023-07-26 ENCOUNTER — Encounter: Payer: Self-pay | Admitting: Orthopedic Surgery

## 2023-07-26 ENCOUNTER — Other Ambulatory Visit: Payer: Self-pay

## 2023-07-26 ENCOUNTER — Ambulatory Visit: Payer: Medicare Other | Admitting: Orthopedic Surgery

## 2023-07-26 ENCOUNTER — Ambulatory Visit
Admission: RE | Admit: 2023-07-26 | Discharge: 2023-07-26 | Disposition: A | Discharge status: Home / Self Care | Payer: Medicare Other | Source: Home / Self Care | Attending: Orthopedic Surgery | Admitting: Orthopedic Surgery

## 2023-07-26 ENCOUNTER — Ambulatory Visit
Admission: RE | Admit: 2023-07-26 | Discharge: 2023-07-26 | Disposition: A | Discharge status: Home / Self Care | Payer: Medicare Other | Source: Ambulatory Visit | Attending: Orthopedic Surgery | Admitting: Orthopedic Surgery

## 2023-07-26 ENCOUNTER — Inpatient Hospital Stay
Admission: EM | Admit: 2023-07-26 | Discharge: 2023-07-29 | DRG: 543 | Disposition: A | Discharge status: Hospice | Payer: Medicare Other | Attending: Internal Medicine | Admitting: Internal Medicine

## 2023-07-26 VITALS — BP 102/60 | Temp 98.1°F | Ht 72.0 in | Wt 157.0 lb

## 2023-07-26 DIAGNOSIS — S32030A Wedge compression fracture of third lumbar vertebra, initial encounter for closed fracture: Secondary | ICD-10-CM

## 2023-07-26 DIAGNOSIS — R29898 Other symptoms and signs involving the musculoskeletal system: Secondary | ICD-10-CM | POA: Diagnosis present

## 2023-07-26 DIAGNOSIS — Z515 Encounter for palliative care: Secondary | ICD-10-CM

## 2023-07-26 DIAGNOSIS — M48061 Spinal stenosis, lumbar region without neurogenic claudication: Secondary | ICD-10-CM

## 2023-07-26 DIAGNOSIS — I10 Essential (primary) hypertension: Secondary | ICD-10-CM | POA: Diagnosis present

## 2023-07-26 DIAGNOSIS — M47816 Spondylosis without myelopathy or radiculopathy, lumbar region: Secondary | ICD-10-CM

## 2023-07-26 DIAGNOSIS — Z9181 History of falling: Secondary | ICD-10-CM

## 2023-07-26 DIAGNOSIS — S32010D Wedge compression fracture of first lumbar vertebra, subsequent encounter for fracture with routine healing: Secondary | ICD-10-CM | POA: Diagnosis not present

## 2023-07-26 DIAGNOSIS — F17210 Nicotine dependence, cigarettes, uncomplicated: Secondary | ICD-10-CM | POA: Diagnosis present

## 2023-07-26 DIAGNOSIS — I7300 Raynaud's syndrome without gangrene: Secondary | ICD-10-CM | POA: Diagnosis present

## 2023-07-26 DIAGNOSIS — R1314 Dysphagia, pharyngoesophageal phase: Secondary | ICD-10-CM | POA: Diagnosis present

## 2023-07-26 DIAGNOSIS — Z79899 Other long term (current) drug therapy: Secondary | ICD-10-CM

## 2023-07-26 DIAGNOSIS — S32030D Wedge compression fracture of third lumbar vertebra, subsequent encounter for fracture with routine healing: Secondary | ICD-10-CM | POA: Diagnosis not present

## 2023-07-26 DIAGNOSIS — Z85828 Personal history of other malignant neoplasm of skin: Secondary | ICD-10-CM

## 2023-07-26 DIAGNOSIS — R1319 Other dysphagia: Secondary | ICD-10-CM

## 2023-07-26 DIAGNOSIS — I252 Old myocardial infarction: Secondary | ICD-10-CM

## 2023-07-26 DIAGNOSIS — M1611 Unilateral primary osteoarthritis, right hip: Secondary | ICD-10-CM | POA: Diagnosis present

## 2023-07-26 DIAGNOSIS — R531 Weakness: Secondary | ICD-10-CM

## 2023-07-26 DIAGNOSIS — M4856XA Collapsed vertebra, not elsewhere classified, lumbar region, initial encounter for fracture: Principal | ICD-10-CM | POA: Diagnosis present

## 2023-07-26 DIAGNOSIS — D649 Anemia, unspecified: Secondary | ICD-10-CM | POA: Insufficient documentation

## 2023-07-26 DIAGNOSIS — E785 Hyperlipidemia, unspecified: Secondary | ICD-10-CM | POA: Diagnosis present

## 2023-07-26 DIAGNOSIS — I159 Secondary hypertension, unspecified: Secondary | ICD-10-CM | POA: Diagnosis present

## 2023-07-26 DIAGNOSIS — Z7982 Long term (current) use of aspirin: Secondary | ICD-10-CM

## 2023-07-26 DIAGNOSIS — M5416 Radiculopathy, lumbar region: Secondary | ICD-10-CM

## 2023-07-26 DIAGNOSIS — S32010A Wedge compression fracture of first lumbar vertebra, initial encounter for closed fracture: Secondary | ICD-10-CM

## 2023-07-26 DIAGNOSIS — Z881 Allergy status to other antibiotic agents status: Secondary | ICD-10-CM

## 2023-07-26 DIAGNOSIS — G8929 Other chronic pain: Principal | ICD-10-CM | POA: Diagnosis present

## 2023-07-26 DIAGNOSIS — M4726 Other spondylosis with radiculopathy, lumbar region: Secondary | ICD-10-CM

## 2023-07-26 DIAGNOSIS — Z8249 Family history of ischemic heart disease and other diseases of the circulatory system: Secondary | ICD-10-CM

## 2023-07-26 DIAGNOSIS — I25111 Atherosclerotic heart disease of native coronary artery with angina pectoris with documented spasm: Secondary | ICD-10-CM | POA: Diagnosis present

## 2023-07-26 DIAGNOSIS — Z681 Body mass index (BMI) 19 or less, adult: Secondary | ICD-10-CM

## 2023-07-26 DIAGNOSIS — W19XXXD Unspecified fall, subsequent encounter: Secondary | ICD-10-CM

## 2023-07-26 DIAGNOSIS — M2578 Osteophyte, vertebrae: Secondary | ICD-10-CM | POA: Diagnosis present

## 2023-07-26 DIAGNOSIS — R627 Adult failure to thrive: Secondary | ICD-10-CM

## 2023-07-26 DIAGNOSIS — E162 Hypoglycemia, unspecified: Secondary | ICD-10-CM | POA: Insufficient documentation

## 2023-07-26 DIAGNOSIS — H409 Unspecified glaucoma: Secondary | ICD-10-CM | POA: Diagnosis present

## 2023-07-26 DIAGNOSIS — Z955 Presence of coronary angioplasty implant and graft: Secondary | ICD-10-CM

## 2023-07-26 DIAGNOSIS — M47812 Spondylosis without myelopathy or radiculopathy, cervical region: Secondary | ICD-10-CM

## 2023-07-26 DIAGNOSIS — I82512 Chronic embolism and thrombosis of left femoral vein: Secondary | ICD-10-CM | POA: Insufficient documentation

## 2023-07-26 LAB — CBC WITH DIFFERENTIAL/PLATELET
Abs Immature Granulocytes: 0.05 10*3/uL (ref 0.00–0.07)
Basophils Absolute: 0.1 10*3/uL (ref 0.0–0.1)
Basophils Relative: 1 %
Eosinophils Absolute: 0.4 10*3/uL (ref 0.0–0.5)
Eosinophils Relative: 4 %
HCT: 38.4 % — ABNORMAL LOW (ref 39.0–52.0)
Hemoglobin: 12.1 g/dL — ABNORMAL LOW (ref 13.0–17.0)
Immature Granulocytes: 1 %
Lymphocytes Relative: 13 %
Lymphs Abs: 1.1 10*3/uL (ref 0.7–4.0)
MCH: 28.1 pg (ref 26.0–34.0)
MCHC: 31.5 g/dL (ref 30.0–36.0)
MCV: 89.3 fL (ref 80.0–100.0)
Monocytes Absolute: 1 10*3/uL (ref 0.1–1.0)
Monocytes Relative: 11 %
Neutro Abs: 6.3 10*3/uL (ref 1.7–7.7)
Neutrophils Relative %: 70 %
Platelets: 373 10*3/uL (ref 150–400)
RBC: 4.3 MIL/uL (ref 4.22–5.81)
RDW: 15.3 % (ref 11.5–15.5)
WBC: 8.9 10*3/uL (ref 4.0–10.5)
nRBC: 0 % (ref 0.0–0.2)

## 2023-07-26 LAB — COMPREHENSIVE METABOLIC PANEL
ALT: 44 U/L (ref 0–44)
AST: 41 U/L (ref 15–41)
Albumin: 3.5 g/dL (ref 3.5–5.0)
Alkaline Phosphatase: 114 U/L (ref 38–126)
Anion gap: 10 (ref 5–15)
BUN: 28 mg/dL — ABNORMAL HIGH (ref 8–23)
CO2: 26 mmol/L (ref 22–32)
Calcium: 9.3 mg/dL (ref 8.9–10.3)
Chloride: 101 mmol/L (ref 98–111)
Creatinine, Ser: 0.92 mg/dL (ref 0.61–1.24)
GFR, Estimated: >60 mL/min (ref >60)
Glucose, Bld: 108 mg/dL — ABNORMAL HIGH (ref 70–99)
Potassium: 4.6 mmol/L (ref 3.5–5.1)
Sodium: 137 mmol/L (ref 135–145)
Total Bilirubin: 0.7 mg/dL (ref 0.3–1.2)
Total Protein: 7.3 g/dL (ref 6.5–8.1)

## 2023-07-26 LAB — TROPONIN I (HIGH SENSITIVITY)
Troponin I (High Sensitivity): 16 ng/L (ref <18)
Troponin I (High Sensitivity): 15 ng/L (ref <18)

## 2023-07-26 LAB — LACTIC ACID, PLASMA: Lactic Acid, Venous: 1.3 mmol/L (ref 0.5–1.9)

## 2023-07-26 MED ORDER — ACETAMINOPHEN 500 MG PO TABS
1000.0000 mg | ORAL_TABLET | Freq: Once | ORAL | Status: DC
  Filled 2023-07-26: qty 2

## 2023-07-26 MED ORDER — DEXAMETHASONE SODIUM PHOSPHATE 10 MG/ML IJ SOLN
10.0000 mg | Freq: Once | INTRAMUSCULAR | Status: AC
  Administered 2023-07-26: 10 mg via INTRAVENOUS
  Filled 2023-07-26: qty 1

## 2023-07-26 MED ORDER — MORPHINE SULFATE (PF) 2 MG/ML IV SOLN
2.0000 mg | Freq: Once | INTRAVENOUS | Status: AC
  Administered 2023-07-26: 2 mg via INTRAVENOUS
  Filled 2023-07-26: qty 1

## 2023-07-26 MED ORDER — GADOBUTROL 1 MMOL/ML IV SOLN
7.0000 mL | Freq: Once | INTRAVENOUS | Status: AC | PRN
  Administered 2023-07-26: 7 mL via INTRAVENOUS

## 2023-07-26 MED ORDER — KETOROLAC TROMETHAMINE 15 MG/ML IJ SOLN
15.0000 mg | Freq: Once | INTRAMUSCULAR | Status: AC
  Administered 2023-07-26: 15 mg via INTRAVENOUS
  Filled 2023-07-26: qty 1

## 2023-07-26 MED ORDER — OXYCODONE HCL 5 MG PO TABS
5.0000 mg | ORAL_TABLET | Freq: Once | ORAL | Status: DC
  Filled 2023-07-26: qty 1

## 2023-07-26 NOTE — ED Notes (Signed)
Pt at radiology  ?

## 2023-07-26 NOTE — ED Provider Notes (Signed)
[facility] Provider Note    Event Date/Time   First MD Initiated Contact with Patient 07/26/23 1830     (approximate)   History   Weakness   HPI  Patrick Malone is a 89 y.o. male   Past medical history of, CAD, hypertension, hyperlipidemia, prior L1 compression fracture with a new L3 burst fracture after a fall in July 2024 with worsening back pain now with difficulty walking due to pain, difficulty lifting legs, worsening overall weakness decline in function at home unable to be cared for by family  He comes from spine clinic today for worsening mobility w/ recommendation admission and MRI of the total spine with and without contrast to rule out epidural abscess/infection, seen by spine team including Dr. Katrinka Blazing of neurosurgery.   He notes longstanding urinary problems that are unchanged, meets with urologist.  He states that the lower back pain radiates to bilateral groin.   Independent Historian contributed to assessment above: Family members at bedside to corroborate information given above  External Medical Documents Reviewed: Spine clinic note from earlier today documenting the above      Physical Exam   Triage Vital Signs: ED Triage Vitals  Encounter Vitals Group     BP 07/26/23 1531 107/66     Systolic BP Percentile --      Diastolic BP Percentile --      Pulse Rate 07/26/23 1531 87     Resp 07/26/23 1531 16     Temp 07/26/23 1531 98 F (36.7 C)     Temp Source 07/26/23 1531 Oral     SpO2 07/26/23 1531 98 %     Weight --      Height --      Head Circumference --      Peak Flow --      Pain Score 07/26/23 1555 6     Pain Loc --      Pain Education --      Exclude from Growth Chart --     Most recent vital signs: Vitals:   07/26/23 1531 07/26/23 2335  BP: 107/66 132/74  Pulse: 87 85  Resp: 16   Temp: 98 F (36.7 C)   SpO2: 98% 99%    General: Awake, no distress.  CV:  Good peripheral perfusion.  Resp:  Normal  effort.  Abd:  No distention.  Other:  Chronically ill-appearing patient no acute distress.  Normal vital signs.  Sensate to bilateral lower extremities.  Soft nontender nondistended abdomen.  Gingerly ranges lower extremities which exacerbates back pain, able to flex and extend the ankle with good strength.   ED Results / Procedures / Treatments   Labs (all labs ordered are listed, but only abnormal results are displayed) Labs Reviewed  COMPREHENSIVE METABOLIC PANEL - Abnormal; Notable for the following components:      Result Value   Glucose, Bld 108 (*)    BUN 28 (*)    All other components within normal limits  CBC WITH DIFFERENTIAL/PLATELET - Abnormal; Notable for the following components:   Hemoglobin 12.1 (*)    HCT 38.4 (*)    All other components within normal limits  CULTURE, BLOOD (ROUTINE X 2)  CULTURE, BLOOD (ROUTINE X 2)  LACTIC ACID, PLASMA  URINALYSIS, ROUTINE W REFLEX MICROSCOPIC  LACTIC ACID, PLASMA  TROPONIN I (HIGH SENSITIVITY)  TROPONIN I (HIGH SENSITIVITY)     I ordered and reviewed the above labs they are notable for cell counts electrolytes  largely unremarkable  EKG  ED ECG REPORT I, Pilar Jarvis, the attending physician, personally viewed and interpreted this ECG.   Date: 07/26/2023  EKG Time: 1541  Rate: 93  Rhythm: sinus  Axis: nl  Intervals:none  ST&T Change: no stemi    RADIOLOGY I independently reviewed and interpreted thoracic spine x-ray and see no obvious fracture or dislocation I also reviewed radiologist's formal read.   PROCEDURES:  Critical Care performed: No  Procedures   MEDICATIONS ORDERED IN ED: Medications  dexamethasone (DECADRON) injection 10 mg (10 mg Intravenous Given 07/26/23 1923)  ketorolac (TORADOL) 15 MG/ML injection 15 mg (15 mg Intravenous Given 07/26/23 1925)  morphine (PF) 2 MG/ML injection 2 mg (2 mg Intravenous Given 07/26/23 2052)  gadobutrol (GADAVIST) 1 MMOL/ML injection 7 mL (7 mLs Intravenous  Contrast Given 07/26/23 2305)    External physician / consultants:  I spoke with hospitalist for admission and regarding care plan for this patient.   IMPRESSION / MDM / ASSESSMENT AND PLAN / ED COURSE  I reviewed the triage vital signs and the nursing notes.                                Patient's presentation is most consistent with acute presentation with potential threat to life or bodily function.  Differential diagnosis includes, but is not limited to, spinal fracture or dislocation, cord compression, cauda equina, epidural abscess, epidural hematoma   The patient is on the cardiac monitor to evaluate for evidence of arrhythmia and/or significant heart rate changes.  MDM:    Acute on chronic worsening of lower back pain with known compression fractures concern for spinal cord compression/hematoma given acute worsening.  No new trauma.  Obtaining MRIs as suggested by spine, admission for further workup and PT/OT/placement  Considered infection but less likely given no fever, subacute worsening of known injuries, defer antibiotics and workup as above       FINAL CLINICAL IMPRESSION(S) / ED DIAGNOSES   Final diagnoses:  Chronic bilateral low back pain, unspecified whether sciatica present     Rx / DC Orders   ED Discharge Orders     None        Note:  This document was prepared using Dragon voice recognition software and may include unintentional dictation errors.    Pilar Jarvis, MD 07/26/23 2352

## 2023-07-26 NOTE — ED Triage Notes (Signed)
BIB daughter from home, returns at the prompt of his NSURG, seen this am in office and sent for xrays and instructed to come to ED for further more complete work up. Here for back pain, described as new with worsening symptoms, including: back, hip & groin pain, increased BLE weakness, and loss of appetite. Alert, NAD, calm, interactive.

## 2023-07-26 NOTE — ED Notes (Signed)
Pt transported to MRI 

## 2023-07-26 NOTE — ED Notes (Addendum)
MRI talking to pt daughter. Pt in room attempting to urinate and have BM. Pt unable to urinate at this time, pt did not want to attempt a BM. Pt shoes and pants placed in bag.

## 2023-07-26 NOTE — ED Notes (Signed)
Pt back from MRI 

## 2023-07-27 ENCOUNTER — Encounter: Payer: Self-pay | Admitting: Internal Medicine

## 2023-07-27 ENCOUNTER — Emergency Department: Payer: Medicare Other

## 2023-07-27 ENCOUNTER — Observation Stay: Payer: Medicare Other

## 2023-07-27 DIAGNOSIS — E349 Endocrine disorder, unspecified: Secondary | ICD-10-CM | POA: Insufficient documentation

## 2023-07-27 DIAGNOSIS — D649 Anemia, unspecified: Secondary | ICD-10-CM

## 2023-07-27 DIAGNOSIS — I82512 Chronic embolism and thrombosis of left femoral vein: Secondary | ICD-10-CM

## 2023-07-27 DIAGNOSIS — S32030A Wedge compression fracture of third lumbar vertebra, initial encounter for closed fracture: Secondary | ICD-10-CM

## 2023-07-27 DIAGNOSIS — R29898 Other symptoms and signs involving the musculoskeletal system: Secondary | ICD-10-CM | POA: Diagnosis not present

## 2023-07-27 DIAGNOSIS — R1319 Other dysphagia: Secondary | ICD-10-CM

## 2023-07-27 DIAGNOSIS — I10 Essential (primary) hypertension: Secondary | ICD-10-CM

## 2023-07-27 DIAGNOSIS — W19XXXD Unspecified fall, subsequent encounter: Secondary | ICD-10-CM

## 2023-07-27 DIAGNOSIS — I25111 Atherosclerotic heart disease of native coronary artery with angina pectoris with documented spasm: Secondary | ICD-10-CM

## 2023-07-27 DIAGNOSIS — S32030D Wedge compression fracture of third lumbar vertebra, subsequent encounter for fracture with routine healing: Secondary | ICD-10-CM

## 2023-07-27 DIAGNOSIS — R627 Adult failure to thrive: Secondary | ICD-10-CM

## 2023-07-27 DIAGNOSIS — M47812 Spondylosis without myelopathy or radiculopathy, cervical region: Secondary | ICD-10-CM

## 2023-07-27 DIAGNOSIS — S32030S Wedge compression fracture of third lumbar vertebra, sequela: Secondary | ICD-10-CM

## 2023-07-27 LAB — CBC
HCT: 34.7 % — ABNORMAL LOW (ref 39.0–52.0)
Hemoglobin: 11 g/dL — ABNORMAL LOW (ref 13.0–17.0)
MCH: 28.4 pg (ref 26.0–34.0)
MCHC: 31.7 g/dL (ref 30.0–36.0)
MCV: 89.4 fL (ref 80.0–100.0)
Platelets: 302 10*3/uL (ref 150–400)
RBC: 3.88 MIL/uL — ABNORMAL LOW (ref 4.22–5.81)
RDW: 15.1 % (ref 11.5–15.5)
WBC: 6 10*3/uL (ref 4.0–10.5)
nRBC: 0 % (ref 0.0–0.2)

## 2023-07-27 LAB — D-DIMER, QUANTITATIVE: D-Dimer, Quant: 2.07 ug{FEU}/mL — ABNORMAL HIGH (ref 0.00–0.50)

## 2023-07-27 LAB — COMPREHENSIVE METABOLIC PANEL
ALT: 34 U/L (ref 0–44)
AST: 28 U/L (ref 15–41)
Albumin: 2.9 g/dL — ABNORMAL LOW (ref 3.5–5.0)
Alkaline Phosphatase: 102 U/L (ref 38–126)
Anion gap: 8 (ref 5–15)
BUN: 31 mg/dL — ABNORMAL HIGH (ref 8–23)
CO2: 26 mmol/L (ref 22–32)
Calcium: 8.5 mg/dL — ABNORMAL LOW (ref 8.9–10.3)
Chloride: 103 mmol/L (ref 98–111)
Creatinine, Ser: 0.9 mg/dL (ref 0.61–1.24)
GFR, Estimated: >60 mL/min (ref >60)
Glucose, Bld: 123 mg/dL — ABNORMAL HIGH (ref 70–99)
Potassium: 4.8 mmol/L (ref 3.5–5.1)
Sodium: 137 mmol/L (ref 135–145)
Total Bilirubin: 0.7 mg/dL (ref 0.3–1.2)
Total Protein: 6.2 g/dL — ABNORMAL LOW (ref 6.5–8.1)

## 2023-07-27 LAB — URINALYSIS, COMPLETE (UACMP) WITH MICROSCOPIC
Bilirubin Urine: NEGATIVE
Glucose, UA: NEGATIVE mg/dL
Hgb urine dipstick: NEGATIVE
Ketones, ur: 5 mg/dL — AB
Leukocytes,Ua: NEGATIVE
Nitrite: NEGATIVE
Protein, ur: NEGATIVE mg/dL
Specific Gravity, Urine: 1.024 (ref 1.005–1.030)
Squamous Epithelial / HPF: 0 /[HPF] (ref 0–5)
pH: 5 (ref 5.0–8.0)

## 2023-07-27 LAB — T4, FREE: Free T4: 1.39 ng/dL — ABNORMAL HIGH (ref 0.61–1.12)

## 2023-07-27 LAB — VITAMIN D 25 HYDROXY (VIT D DEFICIENCY, FRACTURES): Vit D, 25-Hydroxy: 35.47 ng/mL (ref 30–100)

## 2023-07-27 LAB — VITAMIN B12: Vitamin B-12: 611 pg/mL (ref 180–914)

## 2023-07-27 LAB — MAGNESIUM: Magnesium: 2.3 mg/dL (ref 1.7–2.4)

## 2023-07-27 LAB — LACTIC ACID, PLASMA: Lactic Acid, Venous: 1.3 mmol/L (ref 0.5–1.9)

## 2023-07-27 LAB — CK: Total CK: 38 U/L — ABNORMAL LOW (ref 49–397)

## 2023-07-27 LAB — TSH: TSH: 1.766 u[IU]/mL (ref 0.350–4.500)

## 2023-07-27 MED ORDER — ASPIRIN 300 MG RE SUPP: 300.0000 mg | Freq: Once | RECTAL | Status: DC

## 2023-07-27 MED ORDER — PANTOPRAZOLE SODIUM 40 MG IV SOLR
40.0000 mg | Freq: Two times a day (BID) | INTRAVENOUS | Status: DC
  Administered 2023-07-27 – 2023-07-29 (×5): 40 mg via INTRAVENOUS
  Filled 2023-07-27 (×5): qty 10

## 2023-07-27 MED ORDER — HYDROCODONE-ACETAMINOPHEN 5-325 MG PO TABS: 1.0000 | ORAL_TABLET | ORAL | Status: DC | PRN

## 2023-07-27 MED ORDER — SODIUM CHLORIDE 0.9 % IV SOLN: INTRAVENOUS | Status: DC

## 2023-07-27 MED ORDER — ACETAMINOPHEN 325 MG PO TABS: 650.0000 mg | ORAL_TABLET | Freq: Four times a day (QID) | ORAL | Status: DC | PRN

## 2023-07-27 MED ORDER — SODIUM CHLORIDE 0.9% FLUSH
3.0000 mL | Freq: Two times a day (BID) | INTRAVENOUS | Status: DC
  Administered 2023-07-27 – 2023-07-29 (×6): 3 mL via INTRAVENOUS

## 2023-07-27 MED ORDER — ASPIRIN 81 MG PO CHEW: 81.0000 mg | CHEWABLE_TABLET | Freq: Every day | ORAL | Status: DC

## 2023-07-27 MED ORDER — HEPARIN SODIUM (PORCINE) 5000 UNIT/ML IJ SOLN
5000.0000 [IU] | Freq: Three times a day (TID) | INTRAMUSCULAR | Status: DC
  Administered 2023-07-27 – 2023-07-29 (×7): 5000 [IU] via SUBCUTANEOUS
  Filled 2023-07-27 (×7): qty 1

## 2023-07-27 MED ORDER — ACETAMINOPHEN 650 MG RE SUPP: 650.0000 mg | Freq: Four times a day (QID) | RECTAL | Status: DC | PRN

## 2023-07-27 MED ORDER — MORPHINE SULFATE (PF) 2 MG/ML IV SOLN: 2.0000 mg | INTRAVENOUS | Status: DC | PRN

## 2023-07-27 MED ORDER — TIMOLOL MALEATE 0.5 % OP SOLN
1.0000 [drp] | Freq: Every day | OPHTHALMIC | Status: DC
  Administered 2023-07-29: 1 [drp] via OPHTHALMIC
  Filled 2023-07-27 (×2): qty 5

## 2023-07-27 MED ORDER — SODIUM CHLORIDE 0.9 % IV BOLUS
500.0000 mL | Freq: Once | INTRAVENOUS | Status: AC
  Administered 2023-07-27: 500 mL via INTRAVENOUS

## 2023-07-27 NOTE — Plan of Care (Signed)
Consult noted for GOC. In to see patient however he is out of room and off unit. No family present in room. Will follow up tomorrow.

## 2023-07-27 NOTE — Consult Note (Signed)
Consult requested by:  Dr. Hilton Sinclair  Consult requested for:  Back pain, neck issues  Primary Physician:  Dorothey Baseman, MD  History of Present Illness: 07/27/2023 Patrick Malone is here today with a chief complaint of failure to thrive with difficulty over the past several weeks.  He was seen in clinic yesterday and was admitted through the emergency department due to these issues.  During workup, dysphagia was noted.  He underwent a swallow study and was noted to have substantial aspiration risk due to difficulty clearing his food and liquid boluses secondary to anterior osteophyte formation at C4-5 and C6-7.  The patient reports that he had neck surgery in the mid 1980s.  Since that time, he has had substantial dysphagia.  It has worsened recently.  He is also symptomatic from low back pain.  He has a known L3 compression fracture.  He has no radicular complaints.   Review of Systems:  A 10 point review of systems is negative, except for the pertinent positives and negatives detailed in the HPI.  Past Medical History: Past Medical History:  Diagnosis Date   Actinic keratosis    Basal cell carcinoma 02/24/2021   R nose, EDC 03/24/21   Clotting disorder (HCC)    Coronary artery disease    a. 02/2012 PCI ([facility]): OM1 99% (2.5x18 Resolute DES).   ED (erectile dysfunction)    Glaucoma    Hyperlipidemia    Hypertension    Hypotestosteronism    MI (myocardial infarction) (HCC)    Osteoarthritis    Raynaud's disease    Skin cancer years ago   forehead - treated by Dr Jarold Motto   Squamous cell carcinoma of skin 07/13/2022   L lat upper arm, scheduled for [facility]   SVT (supraventricular tachycardia) (HCC)    Syncope and collapse     Past Surgical History: Past Surgical History:  Procedure Laterality Date   CARDIAC CATHETERIZATION     CERVICAL SPINE SURGERY     CORONARY ANGIOPLASTY WITH STENT PLACEMENT  02/29/2012   [facility] placed the stent 2.5 mm x 18 mm Ref #  [id number] LOT # [id number]   lung cyst removed      TONSILLECTOMY     TRACHEAL SURGERY      Allergies: Allergies as of 07/26/2023 - Review Complete 07/26/2023  Allergen Reaction Noted   Cefazolin Diarrhea and Rash 09/07/2015   Clindamycin Diarrhea and Rash 09/07/2015    Medications: Current Meds  Medication Sig   acetaminophen (TYLENOL) 325 MG tablet Take 650 mg by mouth every 6 (six) hours as needed.   aspirin 81 MG chewable tablet Chew 81 mg by mouth daily.    atorvastatin (LIPITOR) 40 MG tablet Take 1 tablet (40 mg total) by mouth daily.   fluorouracil (EFUDEX) 5 % cream Apply topically 2 (two) times daily. Apply for 7 days to scalp and forehead.   lactobacillus acidophilus (BACID) TABS tablet Take 2 tablets by mouth 3 (three) times daily.   oxyCODONE-acetaminophen (PERCOCET) 5-325 MG tablet Take 1 tablet by mouth every 6 (six) hours as needed for severe pain (pain score 7-10).   timolol (TIMOPTIC) 0.5 % ophthalmic solution Place 1 drop into both eyes daily.    Vibegron (GEMTESA) 75 MG TABS Take 1 tablet (75 mg total) by mouth daily.   zinc gluconate 50 MG tablet Take 50 mg by mouth daily.    Social History: Social History   Tobacco Use   Smoking status: Former    Current packs/day:  1.00    Average packs/day: 1 pack/day for 10.0 years (10.0 ttl pk-yrs)    Types: Cigarettes   Smokeless tobacco: Never  Vaping Use   Vaping status: Never Used  Substance Use Topics   Alcohol use: No   Drug use: No    Family Medical History: Family History  Problem Relation Age of Onset   Heart attack Mother 45    Physical Examination: Vitals:   07/27/23 1155 07/27/23 1156  BP: 126/65   Pulse: 72   Resp: 19   Temp:  97.7 F (36.5 C)  SpO2: 100%     General: Patient is in no apparent distress. Attention to examination is appropriate.  Neck:   Limited ROM  Respiratory: Patient is breathing without any difficulty.   NEUROLOGICAL:     Awake, alert, oriented to person,  place, and time.  Speech is clear and fluent.  Cranial Nerves: Pupils equal round and reactive to light.  Facial tone is symmetric.  Facial sensation is symmetric. Shoulder shrug is symmetric. Tongue protrusion is midline.  There is no pronator drift.  Strength: Side Biceps Triceps Deltoid Interossei Grip Wrist Ext. Wrist Flex.  R 5 5 5 5 5 5 5   L 5 5 5 5 5 5 5    Side Iliopsoas Quads Hamstring PF DF EHL  R 5 5 5 5 5 5   L 5 5 5 5 5 5     Bilateral upper and lower extremity sensation is intact to light touch.    No evidence of dysmetria noted.  Gait is untested.     Medical Decision Making  Imaging: MRI CTL spine 07/27/2023 IMPRESSION: 1. No acute abnormality of the cervical, thoracic or lumbar spine. No discitis-osteomyelitis or epidural abscess. 2. Atrophic appearance of the cervical spinal cord at the C3-5 levels. 3. Moderate C6-7 spinal canal stenosis and severe bilateral neural foraminal stenosis. 4. Moderate right and severe left C2-3 neural foraminal stenosis. 5. Unchanged appearance of compression fractures at L1 and L3 compared to MRI from 07/19/2023. No new compression fracture. 6. Narrowing of both lateral recesses at L5-S1 without central spinal canal stenosis.     Electronically Signed   By: Deatra Robinson M.D.   On: 07/27/2023 02:19  Swallow study 07/27/23 - see report, but has significant dysphagia due to C6-7 and C4-5 osteophytes.  I have personally reviewed the images and agree with the above interpretation.  Assessment and Plan: Patrick Malone is a pleasant 89 y.o. male with L3 compression fracture and history of dysphagia after a prior anterior cervical operation in the mid 1980s.  This has recently worsened and he now has evidence of substantial osteophyte formation between C4-5 and C6-7.  For his lower back, I have advised that he pay attention to his low back pain with ambulation.  If he continues to have low back pain, 1 could consider interventional  radiology referral for L3 kyphoplasty.  For his neck, 1 could consider osteophyte removal to help with his dysphagia.  However, he has substantial underlying dysphagia secondary to his anterior cervical surgery in the mid 1980s.  As such, I am concerned that he may not have significant benefit from an osteophyte removal.  Additionally, he has some risk with intubation, as he reports that he has a difficult airway.  An alternative option would be placement of a gastrostomy tube to help with his nourishment.  I suspect he is malnourished which is led to some of his weakness.  I have recommended to the patient  and his daughter to consider palliative care consultation to help him with decision making.  I am available to him if he decides to move forward with osteophyte removal.  He and his daughter expressed understanding of my concerns about his potential for improvement given his long history of dysphagia.    I have communicated my recommendations to the requesting physician and coordinated care to facilitate these recommendations.     Amare Bail K. Myer Haff MD, [facility] Neurosurgery

## 2023-07-27 NOTE — ED Notes (Signed)
Patient transported to X-ray for Modified Barium swallow test.

## 2023-07-27 NOTE — ED Notes (Signed)
Jamie from OT working with patient at the bedside.

## 2023-07-27 NOTE — Assessment & Plan Note (Addendum)
Patient was seen by PT and OT while here.

## 2023-07-27 NOTE — Assessment & Plan Note (Addendum)
Since the patient is strict n.p.o. status, not sure if we will need a procedure or not will hold anticoagulation for right now.  Will continue to discuss with anticoagulation based on what we decide to do.

## 2023-07-27 NOTE — Assessment & Plan Note (Addendum)
Last hemoglobin 10.7

## 2023-07-27 NOTE — Assessment & Plan Note (Addendum)
No complaints of chest pain

## 2023-07-27 NOTE — Assessment & Plan Note (Deleted)
    Latest Ref Rng & Units 07/26/2023    3:52 PM 07/19/2023    4:46 PM 04/27/2023    9:48 AM  CBC  WBC 4.0 - 10.5 K/uL 8.9  8.1  9.9   Hemoglobin 13.0 - 17.0 g/dL 81.1  91.4  78.2   Hematocrit 39.0 - 52.0 % 38.4  38.2  37.5   Platelets 150 - 400 K/uL 373  222  256   Mild stable. Will follow.

## 2023-07-27 NOTE — Assessment & Plan Note (Addendum)
Patient did not do well with modified swallow evaluation.  Case discussed with patient and daughter.  Palliative care consultation.  Most aggressive step would be neurosurgery for the osteophytes of his cervical spine which may not help his issue.  Mentioned possibility of feeding tube which would likely have to be a surgical procedure.  Also, mentioned eating at his own risk risk, knowing that he is a high risk for aspiration.  Continue IV fluids for now n.p.o. for right now.

## 2023-07-27 NOTE — Progress Notes (Signed)
Physical Therapy Evaluation Patient Details Name: Patrick Malone MRN: [id number] DOB: [birth year] Today's Date: 07/27/2023  History of Present Illness  89 y.o. male with medical history significant for hypertension, heart disease, osteoarthritis, glaucoma presenting with lower leg weakness inability to ambulate his legs giving out on him, bilateral hip pain, all since the past few days patient follows with neurosurgery after recent trauma to his back (L1 compression fracture with a new L3 burst fracture after a fall in July 2024) and recommended he come in and evaluation with repeat MRI for concerns of spinal stenosis cord compression.    Clinical Impression  Patient supine in bed upon arrival, with daughter at bedside. Pleasantly agreeable to PT/OT Co-Evaluation. Prior to admission, patient require assist with ADL's at baseline. Patient had been ambulating  with a RW, until approx last two weeks due to pain, therefore primary means of mobility has been wheelchair. Upon evaluation, patient require Min A +2 for bed mobility and transfers (with use of RW). Patient able to ambulate ~ 40 ft with RW (with short rest break after approx 20), increased shakiness in legs at end of ambulation due to fatigue, Min A +2 for assist and lines/leads. Patient reports no increase in pain with  mobility, vitals stable throughout session. Patient will benefit from skilled acute PT services to address functional impairments (see below for additional) and maximize functional mobility to return to PLOF. Anticipate the need for follow up PT services upon acute hospital discharge. Will continue to follow acutely.        If plan is discharge home, recommend the following: A lot of help with walking and/or transfers;A lot of help with bathing/dressing/bathroom;Assist for transportation;Help with stairs or ramp for entrance   Can travel by private vehicle   No    Equipment Recommendations Other (comment) (TBD at next  venue of care)  Recommendations for Other Services       Functional Status Assessment Patient has had a recent decline in their functional status and demonstrates the ability to make significant improvements in function in a reasonable and predictable amount of time.     Precautions / Restrictions Precautions Precautions: Other (comment);Fall Precaution Comments: L1 compression fracture with a new L3 burst fracture after a fall in July 2024. Restrictions Weight Bearing Restrictions: No      Mobility  Bed Mobility Overal bed mobility: Needs Assistance Bed Mobility: Rolling, Supine to Sit, Sit to Supine Rolling: Min assist, +2 for physical assistance   Supine to sit: Min assist, +2 for physical assistance Sit to supine: Min assist, +2 for physical assistance   General bed mobility comments: Patient able to roll to R side with Min A + 2, assist for placement of LUE.  Min A +2 for supine <> sit due to weakness and assist at trunk to obtain upright position. Pt deny pain with mobility, mild SOB. Vitals stable.    Transfers Overall transfer level: Needs assistance Equipment used: Rolling walker (2 wheels) Transfers: Sit to/from Stand Sit to Stand: Min assist, +2 physical assistance           General transfer comment: Pt able to stand from EOB with Min A, + 2 on initial rep progressing to Min A of one on second rep from toilet. Pt require cues for hand placement, technique to promote safety.    Ambulation/Gait Ambulation/Gait assistance: Min assist, +2 safety/equipment Gait Distance (Feet): 40 Feet (two bouts of 20', rest break due to using bathroom) Assistive device: Rolling walker (2  wheels)   Gait velocity: Decreased     General Gait Details: Pt able to ambulate approx 40 ft total (two bouts of ~ 20 ft, rest break on toilet), Min A +2 for safety and lines/lead management. Pt require cues for BOS and step length. With second bout of ambulation, increased fatigue/shakiness  noted in BLE. Vitals stable. Mild SOB noted, no increase in pain reported.  Stairs            Wheelchair Mobility     Tilt Bed    Modified Rankin (Stroke Patients Only)       Balance Overall balance assessment: Needs assistance Sitting-balance support: Feet supported, Single extremity supported Sitting balance-Leahy Scale: Fair Sitting balance - Comments: some intermittent postural lean noted with donning socks with OT due to reduced support. steadying assist required.   Standing balance support: Bilateral upper extremity supported, During functional activity, Reliant on assistive device for balance Standing balance-Leahy Scale: Fair Standing balance comment: mild unsteadiness, increased reliance on RW. No overt LOB noted with mobility                             Pertinent Vitals/Pain Pain Assessment Pain Assessment: No/denies pain    Home Living Family/patient expects to be discharged to:: Private residence Living Arrangements: Spouse/significant other Available Help at Discharge: Family;Available 24 hours/day (two daughters provide assist) Type of Home: House Home Access: Ramped entrance       Home Layout: One level Home Equipment: Agricultural consultant (2 wheels);BSC/3in1;Tub bench      Prior Function Prior Level of Function : Needs assist;History of Falls (last six months)             Mobility Comments: was using RW until past couple weeks 2/2 pain, therefore primary means of mobility has been w/c ADLs Comments: assist at baseline for UB ADL 2/2 old L shoulder injury, assist for showering and LB ADL since July and now assist for toileting in the past 2 weeks. 3 falls in 55mo. Dtrs help with meds, cooking, cleaning, and transportation.     Extremity/Trunk Assessment   Upper Extremity Assessment Upper Extremity Assessment: Generalized weakness (limited ROM/strength on LUE 2/2 history of shoulder injury)    Lower Extremity Assessment Lower  Extremity Assessment: Generalized weakness       Communication   Communication Communication: No apparent difficulties  Cognition Arousal: Alert Behavior During Therapy: WFL for tasks assessed/performed Overall Cognitive Status: Within Functional Limits for tasks assessed                                          General Comments      Exercises     Assessment/Plan    PT Assessment Patient needs continued PT services  PT Problem List Decreased strength;Decreased activity tolerance;Decreased balance;Decreased mobility;Pain       PT Treatment Interventions DME instruction;Gait training;Functional mobility training;Therapeutic activities;Therapeutic exercise;Balance training;Neuromuscular re-education;Patient/family education    PT Goals (Current goals can be found in the Care Plan section)  Acute Rehab PT Goals Patient Stated Goal: get back home PT Goal Formulation: With patient Time For Goal Achievement: 08/10/23 Potential to Achieve Goals: Fair    Frequency Min 1X/week     Co-evaluation PT/OT/SLP Co-Evaluation/Treatment: Yes Reason for Co-Treatment: For patient/therapist safety;To address functional/ADL transfers PT goals addressed during session: Mobility/safety with mobility;Proper use of DME OT  goals addressed during session: ADL's and self-care       AM-PAC PT "6 Clicks" Mobility  Outcome Measure Help needed turning from your back to your side while in a flat bed without using bedrails?: A Little Help needed moving from lying on your back to sitting on the side of a flat bed without using bedrails?: A Little Help needed moving to and from a bed to a chair (including a wheelchair)?: A Little Help needed standing up from a chair using your arms (e.g., wheelchair or bedside chair)?: A Lot Help needed to walk in hospital room?: A Lot Help needed climbing 3-5 steps with a railing? : A Lot 6 Click Score: 15    End of Session Equipment Utilized  During Treatment: Gait belt Activity Tolerance: Patient tolerated treatment well Patient left: in bed;with bed alarm set;with call bell/phone within reach;with family/visitor present Nurse Communication: Mobility status PT Visit Diagnosis: Unsteadiness on feet (R26.81);Other abnormalities of gait and mobility (R26.89);History of falling (Z91.81);Muscle weakness (generalized) (M62.81)    Time: 1610-9604 PT Time Calculation (min) (ACUTE ONLY): 34 min   Charges:   PT Evaluation $PT Eval Moderate Complexity: 1 Mod   PT General Charges $$ ACUTE PT VISIT: 1 Visit         Creed Copper Fairly, PT, DPT 07/27/23 3:50 PM

## 2023-07-27 NOTE — Evaluation (Signed)
Occupational Therapy Evaluation Patient Details Name: Patrick Malone MRN: [id number] DOB: 1932-08-13 Today's Date: 07/27/2023   History of Present Illness 89 y.o. male with medical history significant for hypertension, heart disease, osteoarthritis, glaucoma presenting with lower leg weakness inability to ambulate his legs giving out on him, bilateral hip pain, all since the past few days patient follows with neurosurgery after recent trauma to his back (L1 compression fracture with a new L3 burst fracture after a fall in July 2024) and recommended he come in and evaluation with repeat MRI for concerns of spinal stenosis cord compression.   Clinical Impression   Pt seen for OT evaluation and co-tx with PT to optimize safety for ADL/mobility attempts. Patient supine in bed upon arrival, with daughter at bedside. Pleasantly agreeable to PT/OT Co-Evaluation. Prior to admission, patient requires some assist with ADL at baseline. Patient had been ambulating with a RW, until approx last two weeks due to pain, therefore primary means of mobility has been wheelchair. Also requiring additional assist for ADL over the past two weeks. Upon evaluation, patient require Min A +2 for bed mobility and transfers (with use of RW). Patient able to ambulate ~ 40 ft with RW (with short rest break after approx 20), increased shakiness in legs at end of ambulation due to fatigue, Min A +2 for assist and lines/leads. Pt required MIN A+2 for transfer to/from std height toilet with use of grab bar, MIN-MOD A for clothing mgt, and was able to don socks with MIN A using figure four technique prior to mobility. No pain reported during session, however, pt was premedicated. VSS throughout. Patient will benefit from skilled acute OT services to address functional impairments (see below for additional) and maximize functional mobility to return to PLOF. Anticipate the need for follow up OT services upon acute hospital discharge.       If plan is discharge home, recommend the following: A little help with walking and/or transfers;A lot of help with bathing/dressing/bathroom;Assistance with cooking/housework;Assist for transportation;Help with stairs or ramp for entrance;Direct supervision/assist for medications management    Functional Status Assessment  Patient has had a recent decline in their functional status and demonstrates the ability to make significant improvements in function in a reasonable and predictable amount of time.  Equipment Recommendations  None recommended by OT    Recommendations for Other Services       Precautions / Restrictions Precautions Precautions: Other (comment);Fall Precaution Comments: L1 compression fracture with a new L3 burst fracture after a fall in July 2024. Restrictions Weight Bearing Restrictions: No      Mobility Bed Mobility Overal bed mobility: Needs Assistance Bed Mobility: Rolling, Supine to Sit, Sit to Supine Rolling: Min assist, +2 for physical assistance   Supine to sit: Min assist, +2 for physical assistance Sit to supine: Min assist, +2 for physical assistance   General bed mobility comments: Patient able to roll to R side with Min A + 2, assist for placement of LUE.  Min A +2 for supine <> sit due to weakness and assist at trunk to obtain upright position. Pt deny pain with mobility, mild SOB. Vitals stable.    Transfers Overall transfer level: Needs assistance Equipment used: Rolling walker (2 wheels) Transfers: Sit to/from Stand Sit to Stand: Min assist, +2 physical assistance           General transfer comment: Pt able to stand from EOB with Min A, + 2 on initial rep progressing to Min A of one on  second rep from toilet. Pt require cues for hand placement, technique to promote safety.      Balance Overall balance assessment: Needs assistance Sitting-balance support: Feet supported, Single extremity supported Sitting balance-Leahy Scale:  Fair Sitting balance - Comments: some intermittent postural lean noted with donning socks with OT due to reduced support. steadying assist required.   Standing balance support: Bilateral upper extremity supported, During functional activity, Reliant on assistive device for balance Standing balance-Leahy Scale: Fair Standing balance comment: mild unsteadiness, increased reliance on RW. No overt LOB noted with mobility                           ADL either performed or assessed with clinical judgement   ADL Overall ADL's : Needs assistance/impaired                 Upper Body Dressing : Sitting;Minimal assistance Upper Body Dressing Details (indicate cue type and reason): MIN A to don gown 2/2 LUE deficits Lower Body Dressing: Sit to/from stand;Minimal assistance Lower Body Dressing Details (indicate cue type and reason): MIN A for donning socks using figure four technique Toilet Transfer: Contact guard assist;Grab bars;Rolling walker (2 wheels);Regular Toilet   Toileting- Clothing Manipulation and Hygiene: Minimal assistance;Moderate assistance Toileting - Clothing Manipulation Details (indicate cue type and reason): assist for clothing mgt in standing in order for pt to maintain UE support on RW for balance             Vision         Perception         Praxis         Pertinent Vitals/Pain Pain Assessment Pain Assessment: No/denies pain     Extremity/Trunk Assessment Upper Extremity Assessment Upper Extremity Assessment: Generalized weakness (limited ROM/strength on LUE 2/2 history of shoulder injury)   Lower Extremity Assessment Lower Extremity Assessment: Generalized weakness       Communication Communication Communication: No apparent difficulties   Cognition Arousal: Alert Behavior During Therapy: WFL for tasks assessed/performed Overall Cognitive Status: Within Functional Limits for tasks assessed                                        General Comments       Exercises     Shoulder Instructions      Home Living Family/patient expects to be discharged to:: Private residence Living Arrangements: Spouse/significant other Available Help at Discharge: Family;Available 24 hours/day (two daughters provide assist) Type of Home: House Home Access: Ramped entrance     Home Layout: One level     Bathroom Shower/Tub: Chief Strategy Officer: Handicapped height Bathroom Accessibility: Yes How Accessible: Accessible via wheelchair Home Equipment: Agricultural consultant (2 wheels);BSC/3in1;Tub bench          Prior Functioning/Environment Prior Level of Function : Needs assist;History of Falls (last six months)             Mobility Comments: was using RW until past couple weeks 2/2 pain, therefore primary means of mobility has been w/c ADLs Comments: assist at baseline for UB ADL 2/2 old L shoulder injury, assist for showering and LB ADL since July and now assist for toileting in the past 2 weeks. 3 falls in 53mo. Dtrs help with meds, cooking, cleaning, and transportation.        OT Problem List: Decreased strength;Pain;Decreased  range of motion;Decreased activity tolerance;Decreased knowledge of use of DME or AE;Impaired balance (sitting and/or standing);Impaired UE functional use      OT Treatment/Interventions: Self-care/ADL training;Therapeutic exercise;Therapeutic activities;DME and/or AE instruction;Patient/family education;Balance training;Energy conservation    OT Goals(Current goals can be found in the care plan section) Acute Rehab OT Goals Patient Stated Goal: go home and get stronger OT Goal Formulation: With patient/family Time For Goal Achievement: 08/10/23 Potential to Achieve Goals: Good ADL Goals Pt Will Perform Lower Body Dressing: with caregiver independent in assisting;sit to/from stand Pt Will Transfer to Toilet: with supervision;ambulating (LRAD) Pt Will Perform Toileting -  Clothing Manipulation and hygiene: with modified independence Additional ADL Goal #1: Pt will complete seated bath with MIN A, AE PRN, 1/1 opportunities.  OT Frequency: Min 1X/week    Co-evaluation PT/OT/SLP Co-Evaluation/Treatment: Yes Reason for Co-Treatment: For patient/therapist safety;To address functional/ADL transfers PT goals addressed during session: Mobility/safety with mobility;Proper use of DME OT goals addressed during session: ADL's and self-care      AM-PAC OT "6 Clicks" Daily Activity     Outcome Measure Help from another person eating meals?: None Help from another person taking care of personal grooming?: A Little Help from another person toileting, which includes using toliet, bedpan, or urinal?: A Lot Help from another person bathing (including washing, rinsing, drying)?: A Lot Help from another person to put on and taking off regular upper body clothing?: A Little Help from another person to put on and taking off regular lower body clothing?: A Little 6 Click Score: 17   End of Session Equipment Utilized During Treatment: Gait belt;Rolling walker (2 wheels)  Activity Tolerance: Patient tolerated treatment well Patient left: in bed;with call bell/phone within reach;with bed alarm set;with family/visitor present  OT Visit Diagnosis: Other abnormalities of gait and mobility (R26.89);Muscle weakness (generalized) (M62.81);History of falling (Z91.81)                Time: 1610-9604 OT Time Calculation (min): 34 min Charges:  OT General Charges $OT Visit: 1 Visit OT Evaluation $OT Eval Moderate Complexity: 1 Mod OT Treatments $Self Care/Home Management : 8-22 mins  Arman Filter., MPH, MS, OTR/L ascom [phone] 07/27/23, 4:25 PM

## 2023-07-27 NOTE — ED Notes (Signed)
Patient en route to 1A. Peach, RN is aware.

## 2023-07-27 NOTE — Progress Notes (Signed)
SLP Follow Up Note  Patient Details Name: Patrick Malone MRN: [id number] DOB: 22-Jan-1932  This writer went by to answer any potential questions that pt or his daughter might have following recent Modified Barium Swallow Study. They had already met with neurosurgery and attending with Palliative Care also consult. They didn't have any further questions for this Clinical research associate. ST services will sign off.   Auriella Wieand B. Dreama Saa, M.S., CCC-SLP, Tree surgeon Certified Brain Injury Specialist [facility]  [facility] Rehabilitation Services Office [phone] Ascom [phone] Fax [phone]

## 2023-07-27 NOTE — Evaluation (Signed)
Clinical/Bedside Swallow Evaluation Patient Details  Name: Patrick Malone MRN: [id number] Date of Birth: May 02, 1932  Today's Date: 07/27/2023 Time: SLP Start Time (ACUTE ONLY): 1035 SLP Stop Time (ACUTE ONLY): 1100 SLP Time Calculation (min) (ACUTE ONLY): 25 min  Past Medical History:  Past Medical History:  Diagnosis Date   Actinic keratosis    Basal cell carcinoma 02/24/2021   R nose, EDC 03/24/21   Clotting disorder (HCC)    Coronary artery disease    a. 02/2012 PCI ([facility]): OM1 99% (2.5x18 Resolute DES).   ED (erectile dysfunction)    Glaucoma    Hyperlipidemia    Hypertension    Hypotestosteronism    MI (myocardial infarction) (HCC)    Osteoarthritis    Raynaud's disease    Skin cancer years ago   forehead - treated by Dr Jarold Motto   Squamous cell carcinoma of skin 07/13/2022   L lat upper arm, scheduled for [facility]   SVT (supraventricular tachycardia) (HCC)    Syncope and collapse    Past Surgical History:  Past Surgical History:  Procedure Laterality Date   CARDIAC CATHETERIZATION     CERVICAL SPINE SURGERY     CORONARY ANGIOPLASTY WITH STENT PLACEMENT  02/29/2012   [facility] placed the stent 2.5 mm x 18 mm Ref # [id number] LOT # [id number]   lung cyst removed      TONSILLECTOMY     TRACHEAL SURGERY     HPI:  Pt is a 89 year old male with past medical history of surgical repair of C3-C5 and recent Cervical Spine CT noting "Postsurgical changes are noted from C3-C5 similar to that seen on the prior exam. Osteophytic changes are noted at C6-C7." MRI of cervical spine also revealed "C6-7: Intermediate sized disc osteophyte complex" (07/26/2023). Pt reports history of esophageal damage during cervical surgery in 1984 with periodic dysphagia as a result. Of note, pt had a Barium Swallow revealed Patrick Malone aspiration barium into the trachea, right and left mainstem bronchi (05/02/20220).    Assessment / Plan / Recommendation  Clinical Impression  Pt presents with what is likely  acute on chronic pharyngeal dysphagia c/b the appearance of a swift swallow response but immediate coughing and hocking up of thin liquids at bedside. Given pt's history of dysphagia with previously documented aspiration, recommend pt remain NPO with instrumental swallow study planned for later today. SLP Visit Diagnosis: Dysphagia, pharyngeal phase (R13.13)    Aspiration Risk  Severe aspiration risk;Risk for inadequate nutrition/hydration    Diet Recommendation NPO    Medication Administration: Via alternative means    Other  Recommendations Oral Care Recommendations: Oral care QID    Recommendations for follow up therapy are one component of a multi-disciplinary discharge planning process, led by the attending physician.  Recommendations may be updated based on patient status, additional functional criteria and insurance authorization.  Follow up Recommendations Follow physician's recommendations for discharge plan and follow up therapies      Assistance Recommended at Discharge  TBD  Functional Status Assessment Patient has had a recent decline in their functional status and demonstrates the ability to make significant improvements in function in a reasonable and predictable amount of time.  Frequency and Duration min 2x/week  2 weeks       Prognosis Prognosis for improved oropharyngeal function: Guarded Barriers to Reach Goals:  (history of esophageal dysfunction)      Swallow Study   General Date of Onset: 07/26/23 HPI: Pt is a 89 year old male with past medical  history of surgical repair of C3-C5 and recent Cervical Spine CT noting "Postsurgical changes are noted from C3-C5 similar to that seen on the prior exam. Osteophytic changes are noted at C6-C7." MRI of cervical spine also revealed "C6-7: Intermediate sized disc osteophyte complex" (07/26/2023). Pt reports history of esophageal damage during cervical surgery in 1984 with periodic dysphagia as a result. Of note, pt had a  Barium Swallow revealed Patrick Malone aspiration barium into the trachea, right and left mainstem bronchi (05/02/20220). Type of Study: Bedside Swallow Evaluation Previous Swallow Assessment: none in chart Diet Prior to this Study: NPO Temperature Spikes Noted: No Respiratory Status: Room air History of Recent Intubation: No Behavior/Cognition: Alert;Cooperative;Pleasant mood Oral Cavity Assessment: Within Functional Limits Oral Care Completed by SLP: Recent completion by staff Oral Cavity - Dentition: Missing dentition Vision: Functional for self-feeding Self-Feeding Abilities: Able to feed self Patient Positioning: Upright in bed Baseline Vocal Quality: Normal Volitional Cough: Strong Volitional Swallow: Able to elicit    Oral/Motor/Sensory Function Overall Oral Motor/Sensory Function: Within functional limits   Ice Chips Ice chips: Not tested   Thin Liquid Thin Liquid: Impaired Presentation: Straw;Self Fed Pharyngeal  Phase Impairments: Multiple swallows;Wet Vocal Quality;Cough - Immediate    Nectar Thick Nectar Thick Liquid: Not tested   Honey Thick Honey Thick Liquid: Not tested   Puree Puree: Not tested   Solid     Solid: Not tested     Patrick Malone, M.S., CCC-SLP, Tree surgeon Certified Brain Injury Specialist [facility]  [facility] Office [phone] [address] [phone] Fax [phone]

## 2023-07-27 NOTE — Assessment & Plan Note (Addendum)
Neurosurgical following

## 2023-07-27 NOTE — ED Notes (Signed)
ED TO INPATIENT HANDOFF REPORT  ED Nurse Name and Phone #: Osborne Casco, RN  S Name/Age/Gender Patrick Malone 89 y.o. male Room/Bed: ED37A/ED37A  Code Status   Code Status: Full Code  Home/SNF/Other Home Patient oriented to: self, place, time, and situation Is this baseline? Yes   Triage Complete: Triage complete  Chief Complaint Bilateral leg weakness [R29.898]  Triage Note BIB daughter from home, returns at the prompt of his NSURG, seen this am in office and sent for xrays and instructed to come to ED for further more complete work up. Here for back pain, described as new with worsening symptoms, including: back, hip & groin pain, increased BLE weakness, and loss of appetite. Alert, NAD, calm, interactive.     Allergies Allergies  Allergen Reactions   Cefazolin Diarrhea and Rash   Clindamycin Diarrhea and Rash    Level of Care/Admitting Diagnosis ED Disposition     ED Disposition  Admit   Condition  --   Comment  Hospital Area: [facility] [[id number]]  Level of Care: Telemetry Medical [104]  Covid Evaluation: Asymptomatic - no recent exposure (last 10 days) testing not required  Diagnosis: Bilateral leg weakness [409811]  Admitting Physician: Darrold Junker  Attending Physician: Darrold Junker          B Medical/Surgery History Past Medical History:  Diagnosis Date   Actinic keratosis    Basal cell carcinoma 02/24/2021   R nose, EDC 03/24/21   Clotting disorder (HCC)    Coronary artery disease    a. 02/2012 PCI ([facility]): OM1 99% (2.5x18 Resolute DES).   ED (erectile dysfunction)    Glaucoma    Hyperlipidemia    Hypertension    Hypotestosteronism    MI (myocardial infarction) (HCC)    Osteoarthritis    Raynaud's disease    Skin cancer years ago   forehead - treated by Dr Jarold Motto   Squamous cell carcinoma of skin 07/13/2022   L lat upper arm, scheduled for [facility]   SVT (supraventricular tachycardia) (HCC)    Syncope  and collapse    Past Surgical History:  Procedure Laterality Date   CARDIAC CATHETERIZATION     CERVICAL SPINE SURGERY     CORONARY ANGIOPLASTY WITH STENT PLACEMENT  02/29/2012   [facility] placed the stent 2.5 mm x 18 mm Ref # [id number] LOT # [id number]   lung cyst removed      TONSILLECTOMY     TRACHEAL SURGERY       A IV Location/Drains/Wounds Patient Lines/Drains/Airways Status     Active Line/Drains/Airways     Name Placement date Placement time Site Days   Peripheral IV 07/27/23 20 G Anterior;Right;Distal Forearm 07/27/23  0600  Forearm  less than 1            Intake/Output Last 24 hours  Intake/Output Summary (Last 24 hours) at 07/27/2023 1535 Last data filed at 07/27/2023 0747 Gross per 24 hour  Intake --  Output 200 ml  Net -200 ml    Labs/Imaging Results for orders placed or performed during the hospital encounter of 07/26/23 (from the past 48 hour(s))  Comprehensive metabolic panel     Status: Abnormal   Collection Time: 07/26/23  3:52 PM  Result Value Ref Range   Sodium 137 135 - 145 mmol/L   Potassium 4.6 3.5 - 5.1 mmol/L   Chloride 101 98 - 111 mmol/L   CO2 26 22 - 32 mmol/L   Glucose, Bld 108 (H) 70 -  99 mg/dL    Comment: Glucose reference range applies only to samples taken after fasting for at least 8 hours.   BUN 28 (H) 8 - 23 mg/dL   Creatinine, Ser 0.96 0.61 - 1.24 mg/dL   Calcium 9.3 8.9 - 04.5 mg/dL   Total Protein 7.3 6.5 - 8.1 g/dL   Albumin 3.5 3.5 - 5.0 g/dL   AST 41 15 - 41 U/L   ALT 44 0 - 44 U/L   Alkaline Phosphatase 114 38 - 126 U/L   Total Bilirubin 0.7 0.3 - 1.2 mg/dL   GFR, Estimated >40 >98 mL/min    Comment: (NOTE) Calculated using the CKD-EPI Creatinine Equation (2021)    Anion gap 10 5 - 15    Comment: Performed at [facility], [address] [place], [place] [address]  CBC with Differential     Status: Abnormal   Collection Time: 07/26/23  3:52 PM  Result Value Ref Range   WBC 8.9 4.0 - 10.5 K/uL   RBC  4.30 4.22 - 5.81 MIL/uL   Hemoglobin 12.1 (L) 13.0 - 17.0 g/dL   HCT 78.2 (L) 95.6 - 21.3 %   MCV 89.3 80.0 - 100.0 fL   MCH 28.1 26.0 - 34.0 pg   MCHC 31.5 30.0 - 36.0 g/dL   RDW 08.6 57.8 - 46.9 %   Platelets 373 150 - 400 K/uL   nRBC 0.0 0.0 - 0.2 %   Neutrophils Relative % 70 %   Neutro Abs 6.3 1.7 - 7.7 K/uL   Lymphocytes Relative 13 %   Lymphs Abs 1.1 0.7 - 4.0 K/uL   Monocytes Relative 11 %   Monocytes Absolute 1.0 0.1 - 1.0 K/uL   Eosinophils Relative 4 %   Eosinophils Absolute 0.4 0.0 - 0.5 K/uL   Basophils Relative 1 %   Basophils Absolute 0.1 0.0 - 0.1 K/uL   Immature Granulocytes 1 %   Abs Immature Granulocytes 0.05 0.00 - 0.07 K/uL    Comment: Performed at [facility], [address] [place], [place] [address]  Troponin I (High Sensitivity)     Status: None   Collection Time: 07/26/23  3:52 PM  Result Value Ref Range   Troponin I (High Sensitivity) 16 <18 ng/L    Comment: (NOTE) Elevated high sensitivity troponin I (hsTnI) values and significant  changes across serial measurements may suggest ACS but many other  chronic and acute conditions are known to elevate hsTnI results.  Refer to the "Links" section for chest pain algorithms and additional  guidance. Performed at [facility], [address] [place], [place] [address]   Vitamin B12     Status: None   Collection Time: 07/26/23  3:52 PM  Result Value Ref Range   Vitamin B-12 611 180 - 914 pg/mL    Comment: (NOTE) This assay is not validated for testing neonatal or myeloproliferative syndrome specimens for Vitamin B12 levels. Performed at [facility], [address], [place], [place] [address]   Blood culture (routine x 2)     Status: None (Preliminary result)   Collection Time: 07/26/23  7:07 PM   Specimen: BLOOD  Result Value Ref Range   Specimen Description BLOOD BLOOD LEFT ARM    Special Requests      BOTTLES DRAWN AEROBIC AND ANAEROBIC Blood Culture adequate volume    Culture      NO GROWTH < 24 HOURS Performed at [facility], [address] [place],  [place] [address]    Report Status PENDING   CK     Status: Abnormal   Collection Time: 07/26/23  7:07 PM  Result Value Ref Range   Total CK 38 (L) 49 - 397 U/L    Comment: Performed at [facility], [address] [place], [place] [address]  T4, free     Status: Abnormal   Collection Time: 07/26/23  7:07 PM  Result Value Ref Range   Free T4 1.39 (H) 0.61 - 1.12 ng/dL    Comment: (NOTE) Biotin ingestion may interfere with free T4 tests. If the results are inconsistent with the TSH level, previous test results, or the clinical presentation, then consider biotin interference. If needed, order repeat testing after stopping biotin. Performed at [facility], [address] [place], [place] [address]   TSH     Status: None   Collection Time: 07/26/23  7:07 PM  Result Value Ref Range   TSH 1.766 0.350 - 4.500 uIU/mL    Comment: Performed by a 3rd Generation assay with a functional sensitivity of <=0.01 uIU/mL. Performed at [facility], [address] [place], [place] [address]   Troponin I (High Sensitivity)     Status: None   Collection Time: 07/26/23  7:08 PM  Result Value Ref Range   Troponin I (High Sensitivity) 15 <18 ng/L    Comment: (NOTE) Elevated high sensitivity troponin I (hsTnI) values and significant  changes across serial measurements may suggest ACS but many other  chronic and acute conditions are known to elevate hsTnI results.  Refer to the "Links" section for chest pain algorithms and additional  guidance. Performed at [facility], [address] [place], [place] [address]   Blood culture (routine x 2)     Status: None (Preliminary result)   Collection Time: 07/26/23  7:08 PM   Specimen: BLOOD  Result Value Ref Range   Specimen Description BLOOD BLOOD RIGHT ARM    Special Requests      BOTTLES DRAWN AEROBIC AND  ANAEROBIC Blood Culture adequate volume   Culture      NO GROWTH < 24 HOURS Performed at [facility], [address], [place], [place] [address]    Report Status PENDING   Lactic acid, plasma     Status: None   Collection Time: 07/26/23  7:08 PM  Result Value Ref Range   Lactic Acid, Venous 1.3 0.5 - 1.9 mmol/L    Comment: Performed at [facility], [address] [place], [place] [address]  Lactic acid, plasma     Status: None   Collection Time: 07/27/23  1:54 AM  Result Value Ref Range   Lactic Acid, Venous 1.3 0.5 - 1.9 mmol/L    Comment: Performed at [facility], [address] [place], [place] [address]  D-dimer, quantitative     Status: Abnormal   Collection Time: 07/27/23  1:54 AM  Result Value Ref Range   D-Dimer, Quant 2.07 (H) 0.00 - 0.50 ug/mL-FEU    Comment: (NOTE) At the manufacturer cut-off value of 0.5 g/mL FEU, this assay has a negative predictive value of 95-100%.This assay is intended for use in conjunction with a clinical pretest probability (PTP) assessment model to exclude pulmonary embolism (PE) and deep venous thrombosis (DVT) in outpatients suspected of PE or DVT. Results should be correlated with clinical presentation. Performed at [facility], [address] [place], [place] [address]   Comprehensive metabolic panel  Status: Abnormal   Collection Time: 07/27/23  4:50 AM  Result Value Ref Range   Sodium 137 135 - 145 mmol/L   Potassium 4.8 3.5 - 5.1 mmol/L   Chloride 103 98 - 111 mmol/L   CO2 26 22 - 32 mmol/L   Glucose, Bld 123 (H) 70 - 99 mg/dL    Comment: Glucose reference range applies only to samples taken after fasting for at least 8 hours.   BUN 31 (H) 8 - 23 mg/dL   Creatinine, Ser 2.35 0.61 - 1.24 mg/dL   Calcium 8.5 (L) 8.9 - 10.3 mg/dL   Total Protein 6.2 (L) 6.5 - 8.1 g/dL   Albumin 2.9 (L) 3.5 - 5.0 g/dL   AST 28 15 - 41 U/L   ALT 34 0 - 44 U/L   Alkaline Phosphatase 102 38 - 126 U/L    Total Bilirubin 0.7 0.3 - 1.2 mg/dL   GFR, Estimated >57 >32 mL/min    Comment: (NOTE) Calculated using the CKD-EPI Creatinine Equation (2021)    Anion gap 8 5 - 15    Comment: Performed at [facility], [address] [place], [place] [address]  CBC     Status: Abnormal   Collection Time: 07/27/23  4:50 AM  Result Value Ref Range   WBC 6.0 4.0 - 10.5 K/uL   RBC 3.88 (L) 4.22 - 5.81 MIL/uL   Hemoglobin 11.0 (L) 13.0 - 17.0 g/dL   HCT 27.0 (L) 62.3 - 76.2 %   MCV 89.4 80.0 - 100.0 fL   MCH 28.4 26.0 - 34.0 pg   MCHC 31.7 30.0 - 36.0 g/dL   RDW 83.1 51.7 - 61.6 %   Platelets 302 150 - 400 K/uL   nRBC 0.0 0.0 - 0.2 %    Comment: Performed at [facility], [address], [place], [place] [address]  VITAMIN D 25 Hydroxy (Vit-D Deficiency, Fractures)     Status: None   Collection Time: 07/27/23  4:50 AM  Result Value Ref Range   Vit D, 25-Hydroxy 35.47 30 - 100 ng/mL    Comment: (NOTE) Vitamin D deficiency has been defined by the Institute of Medicine  and an Endocrine Society practice guideline as a level of serum 25-OH  vitamin D less than 20 ng/mL (1,2). The Endocrine Society went on to  further define vitamin D insufficiency as a level between 21 and 29  ng/mL (2).  1. IOM (Institute of Medicine). 2010. Dietary reference intakes for  calcium and D. [place]: [employer]. 2. Holick MF, Binkley [place], Bischoff-Ferrari HA, et al. Evaluation,  treatment, and prevention of vitamin D deficiency: an Endocrine  Society clinical practice guideline, JCEM. 2011 Jul; 96(7): 1911-30.  Performed at [facility], [address], [place], [place] [address]   Magnesium     Status: None   Collection Time: 07/27/23  4:50 AM  Result Value Ref Range   Magnesium 2.3 1.7 - 2.4 mg/dL    Comment: Performed at [facility], [address] [place], [place] [address]  Urinalysis, Complete w Microscopic -Urine, Random     Status: Abnormal    Collection Time: 07/27/23  5:45 AM  Result Value Ref Range   Color, Urine YELLOW (A) YELLOW   APPearance HAZY (A) CLEAR   Specific Gravity, Urine 1.024 1.005 - 1.030   pH 5.0 5.0 - 8.0   Glucose, UA NEGATIVE NEGATIVE mg/dL   Hgb urine dipstick NEGATIVE NEGATIVE  Bilirubin Urine NEGATIVE NEGATIVE   Ketones, ur 5 (A) NEGATIVE mg/dL   Protein, ur NEGATIVE NEGATIVE mg/dL   Nitrite NEGATIVE NEGATIVE   Leukocytes,Ua NEGATIVE NEGATIVE   RBC / HPF 0-5 0 - 5 RBC/hpf   WBC, UA 6-10 0 - 5 WBC/hpf   Bacteria, UA RARE (A) NONE SEEN   Squamous Epithelial / HPF 0 0 - 5 /HPF   Mucus PRESENT     Comment: Performed at [facility], [address] [place], [place] [address]   DG Swallowing Func-Speech Pathology  Result Date: 07/27/2023 Table formatting from the original result was not included. Modified Barium Swallow Study Patient Details Name: Patrick Malone MRN: [id number] Date of Birth: 1932/01/11 Today's Date: 07/27/2023 HPI/PMH: HPI: Pt is a 89 year old male with past medical history of surgical repair of C3-C5 and recent Cervical Spine CT noting "Postsurgical changes are noted from C3-C5 similar to that seen on the prior exam. Osteophytic changes are noted at C6-C7." MRI of cervical spine also revealed "C6-7: Intermediate sized disc osteophyte complex" (07/26/2023). Pt reports history of esophageal damage during cervical surgery in 1984 with periodic dysphagia as a result. Of note, pt had a Barium Swallow revealed [name] aspiration barium into the trachea, right and left mainstem bronchi (05/02/20220). Clinical Impression: Clinical Impression: Pt presents with the appearance of anterior ostephytes at C6-C7 (also confirmed by DG Cervical Spine 07/26/2023 and MRI of cervical spine C6-7: Intermediate sized disc osteophyte complex. When consuming thin liquids via spoon, nectar thick liquids via spoon and puree, very little of each bolus transits thru the UES d/t likely impedence of the osteophytes.  Every with 6-7 swallows per bolus, pt is not able to clear the pyriform sinuses of residue with eventual sensed aspiration of all consistencies. Multiple postional modifications were trialed (head turns, head tilts and chin tuck) with no increaased ability. At this time, continue to recommend strict NPO as pt is at a very high risk of aspiration with all PO consumption. Factors that may increase risk of adverse event in presence of aspiration [name] & [name] 2021): Factors that may increase risk of adverse event in presence of aspiration [name] & [name] 2021): Limited mobility; Frail or deconditioned; Frequent aspiration of large volumes Recommendations/Plan: Swallowing Evaluation Recommendations Swallowing Evaluation Recommendations Recommendations: NPO Medication Administration: Via alternative means Oral care recommendations: Oral care QID (4x/day) Recommended consults: Consider Palliative care Treatment Plan Treatment Plan Treatment recommendations: Therapy as outlined in treatment plan below Follow-up recommendations: Follow physicians's recommendations for discharge plan and follow up therapies Functional status assessment: Patient has had a recent decline in their functional status and demonstrates the ability to make significant improvements in function in a reasonable and predictable amount of time. Treatment frequency: Min 1x/week Treatment duration: 1 week Interventions: Patient/family education Recommendations Recommendations for follow up therapy are one component of a multi-disciplinary discharge planning process, led by the attending physician.  Recommendations may be updated based on patient status, additional functional criteria and insurance authorization. Assessment: Orofacial Exam: Orofacial Exam Oral Cavity: Oral Hygiene: WFL Oral Cavity - Dentition: Missing dentition Orofacial Anatomy: WFL Oral Motor/Sensory Function: WFL Anatomy: Anatomy: Suspected cervical osteophytes; Presence of  cervical hardware Boluses Administered: Boluses Administered Boluses Administered: Thin liquids (Level 0); Mildly thick liquids (Level 2, nectar thick); Puree  Oral Impairment Domain: Oral Impairment Domain Lip Closure: No labial escape Tongue control during bolus hold: Cohesive bolus between tongue to palatal seal Bolus preparation/mastication: Timely and efficient chewing and mashing Bolus transport/lingual motion: Brisk  tongue motion Oral residue: Complete oral clearance Location of oral residue : N/A Initiation of pharyngeal swallow : Posterior laryngeal surface of the epiglottis; Pyriform sinuses  Pharyngeal Impairment Domain: Pharyngeal Impairment Domain Soft palate elevation: No bolus between soft palate (SP)/pharyngeal wall (PW) Laryngeal elevation: Partial superior movement of thyroid cartilage/partial approximation of arytenoids to epiglottic petiole; Minimal superior movement of thyroid cartilage with minimal approximation of arytenoids to epiglottic petiole Anterior hyoid excursion: Partial anterior movement; No anterior movement Epiglottic movement: Partial inversion Laryngeal vestibule closure: Incomplete, narrow column air/contrast in laryngeal vestibule; None, wide column air/contrast in laryngeal vestibule Pharyngeal stripping wave : Present - diminished Pharyngoesophageal segment opening: Minimal distention/minimal duration, marked obstruction of flow; No distension with total obstruction of flow Tongue base retraction: No contrast between tongue base and posterior pharyngeal wall (PPW) Pharyngeal residue: Majority of contrast within or on pharyngeal structures; Minimal to no pharyngeal clearance Location of pharyngeal residue: Pyriform sinuses  Esophageal Impairment Domain: No data recorded Pill: No data recorded Penetration/Aspiration Scale Score: Penetration/Aspiration Scale Score 7.  Material enters airway, passes BELOW cords and not ejected out despite cough attempt by patient: Thin liquids  (Level 0); Mildly thick liquids (Level 2, nectar thick); Puree Compensatory Strategies: Compensatory Strategies Compensatory strategies: Yes Multiple swallows: Ineffective Ineffective Multiple Swallows: Thin liquid (Level 0); Mildly thick liquid (Level 2, nectar thick); Puree Chin tuck: Ineffective Ineffective Chin Tuck: Mildly thick liquid (Level 2, nectar thick) Left head turn: Ineffective Ineffective Left Head Turn: Mildly thick liquid (Level 2, nectar thick) Right head turn: Ineffective Ineffective Right Head Turn: Mildly thick liquid (Level 2, nectar thick)   General Information: Caregiver present: No  Diet Prior to this Study: NPO   Temperature : Normal   Respiratory Status: WFL   Supplemental O2: None (Room air)   History of Recent Intubation: No  Behavior/Cognition: Alert; Cooperative; Pleasant mood Self-Feeding Abilities: Able to self-feed Baseline vocal quality/speech: Normal Volitional Cough: Able to elicit Volitional Swallow: Able to elicit Exam Limitations: No limitations Goal Planning: Prognosis for improved oropharyngeal function: Guarded Barriers to Reach Goals: -- (large anterior cervical osteophytes) No data recorded Patient/Family Stated Goal: pt's dysphagia has worsened over last several days - wish to find out reason why Consulted and agree with results and recommendations: Patient Pain: Pain Assessment Pain Assessment: No/denies pain End of Session: Start Time:SLP Start Time (ACUTE ONLY): 1150 Stop Time: SLP Stop Time (ACUTE ONLY): 1210 Time Calculation:SLP Time Calculation (min) (ACUTE ONLY): 20 min Charges: SLP Evaluations $ SLP Speech Visit: 1 Visit SLP Evaluations $BSS Swallow: 1 Procedure $MBS Swallow: 1 Procedure SLP visit diagnosis: SLP Visit Diagnosis: Dysphagia, pharyngeal phase (R13.13) Past Medical History: Past Medical History: Diagnosis Date  Actinic keratosis   Basal cell carcinoma 02/24/2021  R nose, EDC 03/24/21  Clotting disorder (HCC)   Coronary artery disease   a. 02/2012 PCI  ([facility]): OM1 99% (2.5x18 Resolute DES).  ED (erectile dysfunction)   Glaucoma   Hyperlipidemia   Hypertension   Hypotestosteronism   MI (myocardial infarction) (HCC)   Osteoarthritis   Raynaud's disease   Skin cancer years ago  forehead - treated by Dr Jarold Motto  Squamous cell carcinoma of skin 07/13/2022  L lat upper arm, scheduled for [facility]  SVT (supraventricular tachycardia) (HCC)   Syncope and collapse  Past Surgical History: Past Surgical History: Procedure Laterality Date  CARDIAC CATHETERIZATION    CERVICAL SPINE SURGERY    CORONARY ANGIOPLASTY WITH STENT PLACEMENT  02/29/2012  [facility] placed the stent 2.5 mm x 18 mm  Ref # [id number] LOT # [id number]  lung cyst removed     TONSILLECTOMY    TRACHEAL SURGERY   Happi Overton 07/27/2023, 12:57 PM  US Venous Img Lower Bilateral (DVT)  Result Date: 07/27/2023 CLINICAL DATA:  [id number] Edema leg [id number] Bilateral leg weakness [id number] EXAM: BILATERAL LOWER EXTREMITY VENOUS DOPPLER ULTRASOUND TECHNIQUE: Gray-scale sonography with graded compression, as well as color Doppler and duplex ultrasound were performed to evaluate the lower extremity deep venous systems from the level of the common femoral vein and including the common femoral, femoral, profunda femoral, popliteal and calf veins including the posterior tibial, peroneal and gastrocnemius veins when visible. The superficial great saphenous vein was also interrogated. Spectral Doppler was utilized to evaluate flow at rest and with distal augmentation maneuvers in the common femoral, femoral and popliteal veins. COMPARISON:  None Available. FINDINGS: RIGHT LOWER EXTREMITY Common Femoral Vein: No evidence of thrombus. Normal compressibility, respiratory phasicity and response to augmentation. Saphenofemoral Junction: No evidence of thrombus. Normal compressibility and flow on color Doppler imaging. Profunda Femoral Vein: No evidence of thrombus. Normal compressibility and flow on color Doppler imaging. Femoral  Vein: No evidence of thrombus. Normal compressibility, respiratory phasicity and response to augmentation. Popliteal Vein: No evidence of thrombus. Normal compressibility, respiratory phasicity and response to augmentation. Calf Veins: No evidence of thrombus. Normal compressibility and flow on color Doppler imaging. LEFT LOWER EXTREMITY Common Femoral Vein: No evidence of thrombus. Normal compressibility, respiratory phasicity and response to augmentation. Saphenofemoral Junction: No evidence of thrombus. Normal compressibility and flow on color Doppler imaging. Profunda Femoral Vein: Occluded with echogenic thrombus. Femoral Vein: No evidence of thrombus. Normal compressibility, respiratory phasicity and response to augmentation. Popliteal Vein: No evidence of thrombus. Normal compressibility, respiratory phasicity and response to augmentation. Calf Veins: No evidence of thrombus. Normal compressibility and flow on color Doppler imaging. Other Findings:  None. IMPRESSION: 1. The left profunda femoral vein is occluded with echogenic thrombus, favored more chronic DVT. The remainder of the left lower extremity deep veins are patent. 2. No findings of DVT in the right lower extremity. Electronically Signed   By: Olive Bass M.D.   On: 07/27/2023 08:42   MR CERVICAL SPINE W WO CONTRAST  Result Date: 07/27/2023 CLINICAL DATA:  Back pain with bilateral lower extremity weakness. EXAM: MRI CERVICAL, THORACIC AND LUMBAR SPINE WITHOUT AND WITH CONTRAST TECHNIQUE: Multiplanar and multiecho pulse sequences of the cervical spine, to include the craniocervical junction and cervicothoracic junction, and thoracic and lumbar spine, were obtained without and with intravenous contrast. CONTRAST:  7mL GADAVIST GADOBUTROL 1 MMOL/ML IV SOLN COMPARISON:  Cervical spine MRI 03/08/2013 Cervical spine CT 04/27/2023 Lumbar spine MRI 07/19/2023 FINDINGS: MRI CERVICAL SPINE FINDINGS Alignment: Physiologic. Vertebrae: Anterior fusion  hardware spans the C3-5 levels. There is solid anterior arthrodesis. There is also osseous fusion across the C5-6 disc space. No acute fracture or other focal osseous lesion. Cord: Small caliber spinal cord noted at the C3-5 levels. No spinal cord signal abnormality. Posterior Fossa, vertebral arteries, paraspinal tissues: Vertebral artery flow voids are normal. Normal posterior fossa. Disc levels: C1-2: Unremarkable. C2-3: Small disc bulge with bilateral uncovertebral hypertrophy. There is no spinal canal stenosis. Moderate right and severe left neural foraminal stenosis. C3-4: Anterior fusion. There is no spinal canal stenosis. No neural foraminal stenosis. C4-5: Anterior fusion. There is no spinal canal stenosis. Right asymmetric osseous spurring causes moderate right foraminal stenosis. No left neural foraminal stenosis. C5-6: Limited visualization of the spinal canal due to susceptibility effects  from spinal hardware. No visible central spinal canal stenosis. Limited assessment for neural foraminal stenosis. C6-7: Intermediate sized disc osteophyte complex. Moderate spinal canal stenosis. Severe bilateral neural foraminal stenosis. C7-T1: Normal disc space and facet joints. There is no spinal canal stenosis. No neural foraminal stenosis. MRI THORACIC SPINE FINDINGS Alignment:  Physiologic. Vertebrae: No fracture, evidence of discitis, or bone lesion. Mild degenerative height loss at T4. No abnormal contrast enhancement. Cord:  Normal signal and morphology.  No epidural abnormality. Paraspinal and other soft tissues: Negative. Disc levels: There is mild multilevel degenerative disc disease but no spinal canal stenosis or neural impingement. MRI LUMBAR SPINE FINDINGS Segmentation: Transitional anatomy with lowest disc space labeled as S1-S2, unchanged. Alignment:  Physiologic. Vertebrae: Unchanged appearance of compression fractures at L1 and L3 compared to MRI from 07/19/2023. No new compression fracture. Conus  medullaris and cauda equina: Conus extends to the L2 level. Conus and cauda equina appear normal. No abnormal epidural contrast enhancement. Paraspinal and other soft tissues: Negative. Disc levels: L1-L2: Small disc bulge. No spinal canal stenosis. No neural foraminal stenosis. L2-L3: Small disc bulge. No spinal canal stenosis. No neural foraminal stenosis. L3-L4: Minimal disc bulge and mild facet hypertrophy. No spinal canal stenosis. No neural foraminal stenosis. L4-L5: Small left asymmetric disc bulge. No spinal canal stenosis. Mild left neural foraminal stenosis. L5-S1: Intermediate sized disc bulge with moderate facet arthrosis. Narrowing of both lateral recesses without central spinal canal stenosis. No neural foraminal stenosis. S1-S2: Normal disc space. No spinal canal or neural foraminal stenosis. Visualized sacrum: Normal. IMPRESSION: 1. No acute abnormality of the cervical, thoracic or lumbar spine. No discitis-osteomyelitis or epidural abscess. 2. Atrophic appearance of the cervical spinal cord at the C3-5 levels. 3. Moderate C6-7 spinal canal stenosis and severe bilateral neural foraminal stenosis. 4. Moderate right and severe left C2-3 neural foraminal stenosis. 5. Unchanged appearance of compression fractures at L1 and L3 compared to MRI from 07/19/2023. No new compression fracture. 6. Narrowing of both lateral recesses at L5-S1 without central spinal canal stenosis. Electronically Signed   By: Deatra Robinson M.D.   On: 07/27/2023 02:19   MR THORACIC SPINE W WO CONTRAST  Result Date: 07/27/2023 CLINICAL DATA:  Back pain with bilateral lower extremity weakness. EXAM: MRI CERVICAL, THORACIC AND LUMBAR SPINE WITHOUT AND WITH CONTRAST TECHNIQUE: Multiplanar and multiecho pulse sequences of the cervical spine, to include the craniocervical junction and cervicothoracic junction, and thoracic and lumbar spine, were obtained without and with intravenous contrast. CONTRAST:  7mL GADAVIST GADOBUTROL 1  MMOL/ML IV SOLN COMPARISON:  Cervical spine MRI 03/08/2013 Cervical spine CT 04/27/2023 Lumbar spine MRI 07/19/2023 FINDINGS: MRI CERVICAL SPINE FINDINGS Alignment: Physiologic. Vertebrae: Anterior fusion hardware spans the C3-5 levels. There is solid anterior arthrodesis. There is also osseous fusion across the C5-6 disc space. No acute fracture or other focal osseous lesion. Cord: Small caliber spinal cord noted at the C3-5 levels. No spinal cord signal abnormality. Posterior Fossa, vertebral arteries, paraspinal tissues: Vertebral artery flow voids are normal. Normal posterior fossa. Disc levels: C1-2: Unremarkable. C2-3: Small disc bulge with bilateral uncovertebral hypertrophy. There is no spinal canal stenosis. Moderate right and severe left neural foraminal stenosis. C3-4: Anterior fusion. There is no spinal canal stenosis. No neural foraminal stenosis. C4-5: Anterior fusion. There is no spinal canal stenosis. Right asymmetric osseous spurring causes moderate right foraminal stenosis. No left neural foraminal stenosis. C5-6: Limited visualization of the spinal canal due to susceptibility effects from spinal hardware. No visible central spinal canal stenosis.  Limited assessment for neural foraminal stenosis. C6-7: Intermediate sized disc osteophyte complex. Moderate spinal canal stenosis. Severe bilateral neural foraminal stenosis. C7-T1: Normal disc space and facet joints. There is no spinal canal stenosis. No neural foraminal stenosis. MRI THORACIC SPINE FINDINGS Alignment:  Physiologic. Vertebrae: No fracture, evidence of discitis, or bone lesion. Mild degenerative height loss at T4. No abnormal contrast enhancement. Cord:  Normal signal and morphology.  No epidural abnormality. Paraspinal and other soft tissues: Negative. Disc levels: There is mild multilevel degenerative disc disease but no spinal canal stenosis or neural impingement. MRI LUMBAR SPINE FINDINGS Segmentation: Transitional anatomy with  lowest disc space labeled as S1-S2, unchanged. Alignment:  Physiologic. Vertebrae: Unchanged appearance of compression fractures at L1 and L3 compared to MRI from 07/19/2023. No new compression fracture. Conus medullaris and cauda equina: Conus extends to the L2 level. Conus and cauda equina appear normal. No abnormal epidural contrast enhancement. Paraspinal and other soft tissues: Negative. Disc levels: L1-L2: Small disc bulge. No spinal canal stenosis. No neural foraminal stenosis. L2-L3: Small disc bulge. No spinal canal stenosis. No neural foraminal stenosis. L3-L4: Minimal disc bulge and mild facet hypertrophy. No spinal canal stenosis. No neural foraminal stenosis. L4-L5: Small left asymmetric disc bulge. No spinal canal stenosis. Mild left neural foraminal stenosis. L5-S1: Intermediate sized disc bulge with moderate facet arthrosis. Narrowing of both lateral recesses without central spinal canal stenosis. No neural foraminal stenosis. S1-S2: Normal disc space. No spinal canal or neural foraminal stenosis. Visualized sacrum: Normal. IMPRESSION: 1. No acute abnormality of the cervical, thoracic or lumbar spine. No discitis-osteomyelitis or epidural abscess. 2. Atrophic appearance of the cervical spinal cord at the C3-5 levels. 3. Moderate C6-7 spinal canal stenosis and severe bilateral neural foraminal stenosis. 4. Moderate right and severe left C2-3 neural foraminal stenosis. 5. Unchanged appearance of compression fractures at L1 and L3 compared to MRI from 07/19/2023. No new compression fracture. 6. Narrowing of both lateral recesses at L5-S1 without central spinal canal stenosis. Electronically Signed   By: Deatra Robinson M.D.   On: 07/27/2023 02:19   MR Lumbar Spine W Wo Contrast  Result Date: 07/27/2023 CLINICAL DATA:  Back pain with bilateral lower extremity weakness. EXAM: MRI CERVICAL, THORACIC AND LUMBAR SPINE WITHOUT AND WITH CONTRAST TECHNIQUE: Multiplanar and multiecho pulse sequences of the  cervical spine, to include the craniocervical junction and cervicothoracic junction, and thoracic and lumbar spine, were obtained without and with intravenous contrast. CONTRAST:  7mL GADAVIST GADOBUTROL 1 MMOL/ML IV SOLN COMPARISON:  Cervical spine MRI 03/08/2013 Cervical spine CT 04/27/2023 Lumbar spine MRI 07/19/2023 FINDINGS: MRI CERVICAL SPINE FINDINGS Alignment: Physiologic. Vertebrae: Anterior fusion hardware spans the C3-5 levels. There is solid anterior arthrodesis. There is also osseous fusion across the C5-6 disc space. No acute fracture or other focal osseous lesion. Cord: Small caliber spinal cord noted at the C3-5 levels. No spinal cord signal abnormality. Posterior Fossa, vertebral arteries, paraspinal tissues: Vertebral artery flow voids are normal. Normal posterior fossa. Disc levels: C1-2: Unremarkable. C2-3: Small disc bulge with bilateral uncovertebral hypertrophy. There is no spinal canal stenosis. Moderate right and severe left neural foraminal stenosis. C3-4: Anterior fusion. There is no spinal canal stenosis. No neural foraminal stenosis. C4-5: Anterior fusion. There is no spinal canal stenosis. Right asymmetric osseous spurring causes moderate right foraminal stenosis. No left neural foraminal stenosis. C5-6: Limited visualization of the spinal canal due to susceptibility effects from spinal hardware. No visible central spinal canal stenosis. Limited assessment for neural foraminal stenosis. C6-7: Intermediate sized  disc osteophyte complex. Moderate spinal canal stenosis. Severe bilateral neural foraminal stenosis. C7-T1: Normal disc space and facet joints. There is no spinal canal stenosis. No neural foraminal stenosis. MRI THORACIC SPINE FINDINGS Alignment:  Physiologic. Vertebrae: No fracture, evidence of discitis, or bone lesion. Mild degenerative height loss at T4. No abnormal contrast enhancement. Cord:  Normal signal and morphology.  No epidural abnormality. Paraspinal and other soft  tissues: Negative. Disc levels: There is mild multilevel degenerative disc disease but no spinal canal stenosis or neural impingement. MRI LUMBAR SPINE FINDINGS Segmentation: Transitional anatomy with lowest disc space labeled as S1-S2, unchanged. Alignment:  Physiologic. Vertebrae: Unchanged appearance of compression fractures at L1 and L3 compared to MRI from 07/19/2023. No new compression fracture. Conus medullaris and cauda equina: Conus extends to the L2 level. Conus and cauda equina appear normal. No abnormal epidural contrast enhancement. Paraspinal and other soft tissues: Negative. Disc levels: L1-L2: Small disc bulge. No spinal canal stenosis. No neural foraminal stenosis. L2-L3: Small disc bulge. No spinal canal stenosis. No neural foraminal stenosis. L3-L4: Minimal disc bulge and mild facet hypertrophy. No spinal canal stenosis. No neural foraminal stenosis. L4-L5: Small left asymmetric disc bulge. No spinal canal stenosis. Mild left neural foraminal stenosis. L5-S1: Intermediate sized disc bulge with moderate facet arthrosis. Narrowing of both lateral recesses without central spinal canal stenosis. No neural foraminal stenosis. S1-S2: Normal disc space. No spinal canal or neural foraminal stenosis. Visualized sacrum: Normal. IMPRESSION: 1. No acute abnormality of the cervical, thoracic or lumbar spine. No discitis-osteomyelitis or epidural abscess. 2. Atrophic appearance of the cervical spinal cord at the C3-5 levels. 3. Moderate C6-7 spinal canal stenosis and severe bilateral neural foraminal stenosis. 4. Moderate right and severe left C2-3 neural foraminal stenosis. 5. Unchanged appearance of compression fractures at L1 and L3 compared to MRI from 07/19/2023. No new compression fracture. 6. Narrowing of both lateral recesses at L5-S1 without central spinal canal stenosis. Electronically Signed   By: Deatra Robinson M.D.   On: 07/27/2023 02:19   DG Lumbar Spine Complete  Result Date:  07/27/2023 CLINICAL DATA:  L3 compression fracture EXAM: LUMBAR SPINE - COMPLETE 4+ VIEW COMPARISON:  07/19/2023 MRI FINDINGS: Stable compression deformity of L1 and L3 are seen. Facet hypertrophic changes are noted. Flexion and extension views show no significant instability. Aortic calcifications are seen. No soft tissue abnormality is noted. IMPRESSION: L1 and L3 compression deformities stable from the prior exam. No instability on flexion and extension is noted. Electronically Signed   By: Alcide Clever M.D.   On: 07/27/2023 01:07   DG Pelvis 1-2 Views  Result Date: 07/27/2023 CLINICAL DATA:  Bilateral hip pain, initial encounter EXAM: PELVIS - 1-2 VIEW COMPARISON:  07/27/2018 FINDINGS: Pelvic ring is intact. Mild degenerative changes of the hip joints are seen. Dystrophic bony formation along the lateral left iliac wing is noted. This is stable from the prior exam. No soft tissue abnormality is seen. IMPRESSION: Chronic changes without acute abnormality. Electronically Signed   By: Alcide Clever M.D.   On: 07/27/2023 01:05   CT HEAD WO CONTRAST ( )  Result Date: 07/27/2023 CLINICAL DATA:  Dysphagia, unexplained LE weakness EXAM: CT HEAD WITHOUT CONTRAST TECHNIQUE: Contiguous axial images were obtained from the base of the skull through the vertex without intravenous contrast. RADIATION DOSE REDUCTION: This exam was performed according to the departmental dose-optimization program which includes automated exposure control, adjustment of the mA and/or kV according to patient size and/or use of iterative reconstruction technique. COMPARISON:  CT head 04/27/2023 FINDINGS: Brain: Cerebral ventricle sizes are concordant with the degree of cerebral volume loss. Patchy and confluent areas of decreased attenuation are noted throughout the deep and periventricular white matter of the cerebral hemispheres bilaterally, compatible with chronic microvascular ischemic disease. no evidence of large-territorial acute  infarction. No parenchymal hemorrhage. No mass lesion. No extra-axial collection. No mass effect or midline shift. No hydrocephalus. Basilar cisterns are patent. Vascular: No hyperdense vessel. Atherosclerotic calcifications are present within the cavernous internal carotid arteries. Skull: No acute fracture or focal lesion. Sinuses/Orbits: Paranasal sinuses and mastoid air cells are clear. The orbits are unremarkable. Other: None. IMPRESSION: No acute intracranial abnormality. Electronically Signed   By: Tish Frederickson M.D.   On: 07/27/2023 00:14   DG Cervical Spine 2-3 Views  Result Date: 07/26/2023 CLINICAL DATA:  Neck pain EXAM: CERVICAL SPINE - 3 VIEW COMPARISON:  06/11/2013 FINDINGS: Seven cervical segments are well visualized. Postsurgical changes are noted from C3-C5 similar to that seen on the prior exam. Osteophytic changes are noted at C6-C7. No prevertebral soft tissue abnormality is noted. The odontoid is within normal limits. Carotid calcifications are seen. IMPRESSION: Postsurgical changes stable from the prior exam. No acute abnormality noted. Electronically Signed   By: Alcide Clever M.D.   On: 07/26/2023 21:43   DG Thoracic Spine 2 View  Result Date: 07/26/2023 CLINICAL DATA:  pain EXAM: THORACIC SPINE 2 VIEWS COMPARISON:  CT abdomen pelvis 07/27/2018 trauma CT chest 01/01/2013 FINDINGS: Limited evaluation due to overlapping osseous structures and overlying soft tissues. There is no evidence of thoracic spine fracture. Alignment is normal. No other significant bone abnormalities are identified. Punctate calcifications overlying the right lower lung zone and to a lesser extent left lower lung zone. Atherosclerotic plaque. IMPRESSION: 1. No acute displaced fracture or traumatic listhesis of the thoracic spine. Limited evaluation due to overlapping osseous structures and overlying soft tissues. 2.  Aortic Atherosclerosis (ICD10-I70.0). Electronically Signed   By: Tish Frederickson M.D.   On:  07/26/2023 21:42    Pending Labs Unresulted Labs (From admission, onward)    None       Vitals/Pain Today's Vitals   07/27/23 1021 07/27/23 1155 07/27/23 1156 07/27/23 1505  BP:  126/65  (!) 141/70  Pulse:  72  85  Resp:  19  19  Temp:   97.7 F (36.5 C) 97.6 F (36.4 C)  TempSrc:   Oral Oral  SpO2:  100%  100%  Height: 6' (1.829 m)     PainSc:    0-No pain    Isolation Precautions No active isolations  Medications Medications  heparin injection 5,000 Units (5,000 Units Subcutaneous Given 07/27/23 1514)  sodium chloride flush (NS) 0.9 % injection 3 mL (3 mLs Intravenous Given 07/27/23 1156)  acetaminophen (TYLENOL) tablet 650 mg (has no administration in time range)    Or  acetaminophen (TYLENOL) suppository 650 mg (has no administration in time range)  HYDROcodone-acetaminophen (NORCO/VICODIN) 5-325 MG per tablet 1 tablet (has no administration in time range)  morphine (PF) 2 MG/ML injection 2 mg (has no administration in time range)  timolol (TIMOPTIC) 0.5 % ophthalmic solution 1 drop (0 drops Both Eyes Hold 07/27/23 1156)  aspirin suppository 300 mg (0 mg Rectal Hold 07/27/23 1158)  pantoprazole (PROTONIX) injection 40 mg (40 mg Intravenous Given 07/27/23 1502)  0.9 %  sodium chloride infusion ( Intravenous New Bag/Given 07/27/23 1504)  dexamethasone (DECADRON) injection 10 mg (10 mg Intravenous Given 07/26/23 1923)  ketorolac (TORADOL) 15 MG/ML  injection 15 mg (15 mg Intravenous Given 07/26/23 1925)  morphine (PF) 2 MG/ML injection 2 mg (2 mg Intravenous Given 07/26/23 2052)  gadobutrol (GADAVIST) 1 MMOL/ML injection 7 mL (7 mLs Intravenous Contrast Given 07/26/23 2305)  sodium chloride 0.9 % bolus 500 mL (0 mLs Intravenous Stopped 07/27/23 1451)    Mobility non-ambulatory     Focused Assessments     R Recommendations: See Admitting Provider Note  Report given to:   Additional Notes:

## 2023-07-27 NOTE — Hospital Course (Addendum)
89 year old man past medical history of hypertension, heart disease, osteoarthritis, glaucoma presents with weakness and inability to ambulate and pain in his right groin and hip area.  Patient also coughed when doing bedside swallow evaluation and patient also failed modified barium swallow.  MRI cervical, thoracic and lumbar spine reviewed.  10/25.  Patient thinking about options on what to do.  Did not do well with swallow evaluation yesterday.  Considering eating at known risk versus feeding tube versus surgery on his neck for the osteophytes. 10/26.  Patient just wants to go home and eat and does not want any invasive procedures.  He is agreeable to hospice.  I talked to him about CODE STATUS and he would like to remain a full code currently.  I told him if he declines and wants to go to the hospice home he will have to be a DNR.

## 2023-07-27 NOTE — Assessment & Plan Note (Addendum)
No complaints of pain in his back at this time.

## 2023-07-27 NOTE — H&P (Signed)
History and Physical    Patient: Patrick Malone NWG:[id number] DOB: 1932/03/16 DOA: 07/26/2023 DOS: the patient was seen and examined on 07/27/2023 PCP: Dorothey Baseman, MD  Patient coming from: Home  Chief Complaint:  Chief Complaint  Patient presents with   Weakness    HPI: Patrick Malone is a 89 y.o. male with medical history significant for hypertension, heart disease, osteoarthritis, glaucoma presenting with lower leg weakness inability to ambulate his legs giving out on him, bilateral hip pain, all since the past few days patient follows with neurosurgery Dr. Osborne [facility] after recent trauma to his back and recommended he come in and evaluation with repeat MRI for concerns of spinal stenosis cord compression, MRI has been done in the emergency room reports are still pending.  At bedside when I am evaluating the patient he is reporting dysphagia with new worsening over the past few days, difficulty with abducting both his legs and therefore ambulating, low back pain severe back pain.  Daughter at bedside.  Discussed with daughter that his clinical scenario also warrants that we evaluate him for CVA and rule that out also that he may have could be an infection could be a spinal issue.  Another differential is also DVTs as he 2+ bilateral pitting edema.  In emergency room vitals show Blood pressure 132/74, pulse 85, temperature 98 F (36.7 C), temperature source Oral, resp. rate 16, SpO2 99%. Labs are notable for metabolic panel shows glucose of 108 normal electrolytes normal kidney function normal liver function normal troponin normal lactic acid. CBC shows normal white count of 8.9 hemoglobin of 12.1 normal platelet count of 373.  Pt received: In the emergency room patient had 10 mg of Decadron, ketorolac, morphine, which helped him through his pain when he got the MRI. Medications  heparin injection 5,000 Units (5,000 Units Subcutaneous Patient Refused/Not Given 07/27/23 0139)   sodium chloride flush (NS) 0.9 % injection 3 mL (3 mLs Intravenous Given 07/27/23 0139)  acetaminophen (TYLENOL) tablet 650 mg (has no administration in time range)    Or  acetaminophen (TYLENOL) suppository 650 mg (has no administration in time range)  HYDROcodone-acetaminophen (NORCO/VICODIN) 5-325 MG per tablet 1 tablet (has no administration in time range)  morphine (PF) 2 MG/ML injection 2 mg (has no administration in time range)  aspirin chewable tablet 81 mg (has no administration in time range)  timolol (TIMOPTIC) 0.5 % ophthalmic solution 1 drop (has no administration in time range)  dexamethasone (DECADRON) injection 10 mg (10 mg Intravenous Given 07/26/23 1923)  ketorolac (TORADOL) 15 MG/ML injection 15 mg (15 mg Intravenous Given 07/26/23 1925)  morphine (PF) 2 MG/ML injection 2 mg (2 mg Intravenous Given 07/26/23 2052)  gadobutrol (GADAVIST) 1 MMOL/ML injection 7 mL (7 mLs Intravenous Contrast Given 07/26/23 2305)    Review of Systems  Musculoskeletal:  Positive for back pain and joint pain.       Hip pain  Neurological:  Positive for weakness.   Past Medical History:  Diagnosis Date   Actinic keratosis    Basal cell carcinoma 02/24/2021   R nose, EDC 03/24/21   Clotting disorder (HCC)    Coronary artery disease    a. 02/2012 PCI ([facility]): OM1 99% (2.5x18 Resolute DES).   ED (erectile dysfunction)    Glaucoma    Hyperlipidemia    Hypertension    Hypotestosteronism    MI (myocardial infarction) (HCC)    Osteoarthritis    Raynaud's disease    Skin cancer years ago  forehead - treated by Dr Jarold Motto   Squamous cell carcinoma of skin 07/13/2022   L lat upper arm, scheduled for [facility]   SVT (supraventricular tachycardia) (HCC)    Syncope and collapse    Past Surgical History:  Procedure Laterality Date   CARDIAC CATHETERIZATION     CERVICAL SPINE SURGERY     CORONARY ANGIOPLASTY WITH STENT PLACEMENT  02/29/2012   [facility] placed the stent 2.5 mm x 18 mm Ref #  [id number] LOT # [id number]   lung cyst removed      TONSILLECTOMY     TRACHEAL SURGERY      reports that he has quit smoking. His smoking use included cigarettes. He has a 10 pack-year smoking history. He has never used smokeless tobacco. He reports that he does not drink alcohol and does not use drugs.  Allergies  Allergen Reactions   Cefazolin Diarrhea and Rash   Clindamycin Diarrhea and Rash    Family History  Problem Relation Age of Onset   Heart attack Mother 74    Prior to Admission medications   Medication Sig Start Date End Date Taking? Authorizing Provider  acetaminophen (TYLENOL) 325 MG tablet Take 650 mg by mouth every 6 (six) hours as needed.   Yes [provider]  aspirin 81 MG chewable tablet Chew 81 mg by mouth daily.    Yes [provider]  atorvastatin (LIPITOR) 40 MG tablet Take 1 tablet (40 mg total) by mouth daily. 07/17/23 07/11/24 Yes Gollan, Tollie Pizza, MD  fluorouracil (EFUDEX) 5 % cream Apply topically 2 (two) times daily. Apply for 7 days to scalp and forehead. 09/02/21  Yes Deirdre Evener, MD  lactobacillus acidophilus (BACID) TABS tablet Take 2 tablets by mouth 3 (three) times daily.   Yes [provider]  oxyCODONE-acetaminophen (PERCOCET) 5-325 MG tablet Take 1 tablet by mouth every 6 (six) hours as needed for severe pain (pain score 7-10). 07/19/23 07/18/24 Yes Phineas Semen, MD  timolol (TIMOPTIC) 0.5 % ophthalmic solution Place 1 drop into both eyes daily.  02/05/14  Yes [provider]  Vibegron (GEMTESA) 75 MG TABS Take 1 tablet (75 mg total) by mouth daily. 07/18/23  Yes Vaillancourt, Samantha, PA-C  zinc gluconate 50 MG tablet Take 50 mg by mouth daily.   Yes [provider]     Vitals:   07/26/23 1531 07/26/23 2335  BP: 107/66 132/74  Pulse: 87 85  Resp: 16   Temp: 98 F (36.7 C)   TempSrc: Oral   SpO2: 98% 99%   Physical Exam Vitals and nursing note reviewed.  Constitutional:       General: He is not in acute distress.    Appearance: He is underweight.  HENT:     Head: Normocephalic and atraumatic.     Right Ear: Hearing normal.     Left Ear: Hearing normal.     Nose: Nose normal. No nasal deformity.     Mouth/Throat:     Lips: Pink.     Tongue: No lesions.     Pharynx: Oropharynx is clear.  Eyes:     General: Lids are normal.     Extraocular Movements: Extraocular movements intact.  Cardiovascular:     Rate and Rhythm: Normal rate and regular rhythm.     Heart sounds: Normal heart sounds.  Pulmonary:     Effort: Pulmonary effort is normal.     Breath sounds: Normal breath sounds.  Abdominal:     General: Bowel sounds are  normal. There is no distension.     Palpations: Abdomen is soft. There is no mass.     Tenderness: There is no abdominal tenderness.  Musculoskeletal:     Right lower leg: Edema present.     Left lower leg: Edema present.  Skin:    General: Skin is warm.  Neurological:     General: No focal deficit present.     Mental Status: He is alert and oriented to person, place, and time.     Cranial Nerves: Cranial nerves 2-12 are intact.  Psychiatric:        Attention and Perception: Attention normal.        Mood and Affect: Mood normal.        Speech: Speech normal.        Behavior: Behavior normal. Behavior is cooperative.    Labs on Admission: I have personally reviewed following labs and imaging studies Results for orders placed or performed during the hospital encounter of 07/26/23 (from the past 24 hour(s))  Comprehensive metabolic panel     Status: Abnormal   Collection Time: 07/26/23  3:52 PM  Result Value Ref Range   Sodium 137 135 - 145 mmol/L   Potassium 4.6 3.5 - 5.1 mmol/L   Chloride 101 98 - 111 mmol/L   CO2 26 22 - 32 mmol/L   Glucose, Bld 108 (H) 70 - 99 mg/dL   BUN 28 (H) 8 - 23 mg/dL   Creatinine, Ser 1.30 0.61 - 1.24 mg/dL   Calcium 9.3 8.9 - 86.5 mg/dL   Total Protein 7.3 6.5 - 8.1 g/dL   Albumin 3.5 3.5 - 5.0  g/dL   AST 41 15 - 41 U/L   ALT 44 0 - 44 U/L   Alkaline Phosphatase 114 38 - 126 U/L   Total Bilirubin 0.7 0.3 - 1.2 mg/dL   GFR, Estimated >78 >46 mL/min   Anion gap 10 5 - 15  CBC with Differential     Status: Abnormal   Collection Time: 07/26/23  3:52 PM  Result Value Ref Range   WBC 8.9 4.0 - 10.5 K/uL   RBC 4.30 4.22 - 5.81 MIL/uL   Hemoglobin 12.1 (L) 13.0 - 17.0 g/dL   HCT 96.2 (L) 95.2 - 84.1 %   MCV 89.3 80.0 - 100.0 fL   MCH 28.1 26.0 - 34.0 pg   MCHC 31.5 30.0 - 36.0 g/dL   RDW 32.4 40.1 - 02.7 %   Platelets 373 150 - 400 K/uL   nRBC 0.0 0.0 - 0.2 %   Neutrophils Relative % 70 %   Neutro Abs 6.3 1.7 - 7.7 K/uL   Lymphocytes Relative 13 %   Lymphs Abs 1.1 0.7 - 4.0 K/uL   Monocytes Relative 11 %   Monocytes Absolute 1.0 0.1 - 1.0 K/uL   Eosinophils Relative 4 %   Eosinophils Absolute 0.4 0.0 - 0.5 K/uL   Basophils Relative 1 %   Basophils Absolute 0.1 0.0 - 0.1 K/uL   Immature Granulocytes 1 %   Abs Immature Granulocytes 0.05 0.00 - 0.07 K/uL  Troponin I (High Sensitivity)     Status: None   Collection Time: 07/26/23  3:52 PM  Result Value Ref Range   Troponin I (High Sensitivity) 16 <18 ng/L  CK     Status: Abnormal   Collection Time: 07/26/23  7:07 PM  Result Value Ref Range   Total CK 38 (L) 49 - 397 U/L  Troponin I (High Sensitivity)  Status: None   Collection Time: 07/26/23  7:08 PM  Result Value Ref Range   Troponin I (High Sensitivity) 15 <18 ng/L  Lactic acid, plasma     Status: None   Collection Time: 07/26/23  7:08 PM  Result Value Ref Range   Lactic Acid, Venous 1.3 0.5 - 1.9 mmol/L  Lactic acid, plasma     Status: None   Collection Time: 07/27/23  1:54 AM  Result Value Ref Range   Lactic Acid, Venous 1.3 0.5 - 1.9 mmol/L    CBC: Recent Labs  Lab 07/26/23 1552  WBC 8.9  NEUTROABS 6.3  HGB 12.1*  HCT 38.4*  MCV 89.3  PLT 373   Basic Metabolic Panel: Recent Labs  Lab 07/26/23 1552  NA 137  K 4.6  CL 101  CO2 26  GLUCOSE  108*  BUN 28*  CREATININE 0.92  CALCIUM 9.3   GFR: Estimated Creatinine Clearance: 54.8 mL/min (by C-G formula based on SCr of 0.92 mg/dL). Liver Function Tests: Recent Labs  Lab 07/26/23 1552  AST 41  ALT 44  ALKPHOS 114  BILITOT 0.7  PROT 7.3  ALBUMIN 3.5   No results for input(s): "LIPASE", "AMYLASE" in the last 168 hours. No results for input(s): "AMMONIA" in the last 168 hours. Coagulation Profile: No results for input(s): "INR", "PROTIME" in the last 168 hours. Cardiac Enzymes: Recent Labs  Lab 07/26/23 1907  CKTOTAL 38*   BNP (last 3 results) No results for input(s): "PROBNP" in the last 8760 hours. HbA1C: No results for input(s): "HGBA1C" in the last 72 hours. CBG: No results for input(s): "GLUCAP" in the last 168 hours. Lipid Profile: No results for input(s): "CHOL", "HDL", "LDLCALC", "TRIG", "CHOLHDL", "LDLDIRECT" in the last 72 hours. Thyroid Function Tests: No results for input(s): "TSH", "T4TOTAL", "FREET4", "T3FREE", "THYROIDAB" in the last 72 hours. Anemia Panel: No results for input(s): "VITAMINB12", "FOLATE", "FERRITIN", "TIBC", "IRON", "RETICCTPCT" in the last 72 hours. Urinalysis    Component Value Date/Time   COLORURINE YELLOW (A) 07/19/2023 2143   APPEARANCEUR HAZY (A) 07/19/2023 2143   APPEARANCEUR Clear 06/16/2023 0921   LABSPEC 1.025 07/19/2023 2143   PHURINE 5.0 07/19/2023 2143   GLUCOSEU NEGATIVE 07/19/2023 2143   HGBUR NEGATIVE 07/19/2023 2143   BILIRUBINUR NEGATIVE 07/19/2023 2143   BILIRUBINUR Negative 06/16/2023 0921   KETONESUR NEGATIVE 07/19/2023 2143   PROTEINUR 30 (A) 07/19/2023 2143   NITRITE NEGATIVE 07/19/2023 2143   LEUKOCYTESUR NEGATIVE 07/19/2023 2143    Unresulted Labs (From admission, onward)     Start     Ordered   07/27/23 0500  Comprehensive metabolic panel  Tomorrow morning,   R        07/27/23 0045   07/27/23 0500  CBC  Tomorrow morning,   R        07/27/23 0045   07/27/23 0231  VITAMIN D 25 Hydroxy (Vit-D  Deficiency, Fractures)  Add-on,   AD        07/27/23 0230   07/27/23 0231  Magnesium  Add-on,   AD        07/27/23 0230   07/27/23 0204  T4, free  Once,   AD        07/27/23 0204   07/27/23 0204  TSH  Once,   AD        07/27/23 0204   07/27/23 0045  Vitamin B12  Add-on,   AD        07/27/23 0045   07/27/23 0042  D-dimer, quantitative  ONCE - STAT,   STAT        07/27/23 0045   07/27/23 0004  Urinalysis, Complete w Microscopic -Urine, Random  Once,   URGENT       Question:  Specimen Source  Answer:  Urine, Random   07/27/23 0006   07/26/23 1848  Blood culture (routine x 2)  BLOOD CULTURE X 2,   STAT      07/26/23 1849   07/26/23 1550  Urinalysis, Routine w reflex microscopic -Urine, Clean Catch  Once,   URGENT       Question:  Specimen Source  Answer:  Urine, Clean Catch   07/26/23 1551            Medications  heparin injection 5,000 Units (5,000 Units Subcutaneous Patient Refused/Not Given 07/27/23 0139)  sodium chloride flush (NS) 0.9 % injection 3 mL (3 mLs Intravenous Given 07/27/23 0139)  acetaminophen (TYLENOL) tablet 650 mg (has no administration in time range)    Or  acetaminophen (TYLENOL) suppository 650 mg (has no administration in time range)  HYDROcodone-acetaminophen (NORCO/VICODIN) 5-325 MG per tablet 1 tablet (has no administration in time range)  morphine (PF) 2 MG/ML injection 2 mg (has no administration in time range)  aspirin chewable tablet 81 mg (has no administration in time range)  timolol (TIMOPTIC) 0.5 % ophthalmic solution 1 drop (has no administration in time range)  dexamethasone (DECADRON) injection 10 mg (10 mg Intravenous Given 07/26/23 1923)  ketorolac (TORADOL) 15 MG/ML injection 15 mg (15 mg Intravenous Given 07/26/23 1925)  morphine (PF) 2 MG/ML injection 2 mg (2 mg Intravenous Given 07/26/23 2052)  gadobutrol (GADAVIST) 1 MMOL/ML injection 7 mL (7 mLs Intravenous Contrast Given 07/26/23 2305)    Radiological Exams on Admission: MR  CERVICAL SPINE W WO CONTRAST  Result Date: 07/27/2023 CLINICAL DATA:  Back pain with bilateral lower extremity weakness. EXAM: MRI CERVICAL, THORACIC AND LUMBAR SPINE WITHOUT AND WITH CONTRAST TECHNIQUE: Multiplanar and multiecho pulse sequences of the cervical spine, to include the craniocervical junction and cervicothoracic junction, and thoracic and lumbar spine, were obtained without and with intravenous contrast. CONTRAST:  7mL GADAVIST GADOBUTROL 1 MMOL/ML IV SOLN COMPARISON:  Cervical spine MRI 03/08/2013 Cervical spine CT 04/27/2023 Lumbar spine MRI 07/19/2023 FINDINGS: MRI CERVICAL SPINE FINDINGS Alignment: Physiologic. Vertebrae: Anterior fusion hardware spans the C3-5 levels. There is solid anterior arthrodesis. There is also osseous fusion across the C5-6 disc space. No acute fracture or other focal osseous lesion. Cord: Small caliber spinal cord noted at the C3-5 levels. No spinal cord signal abnormality. Posterior Fossa, vertebral arteries, paraspinal tissues: Vertebral artery flow voids are normal. Normal posterior fossa. Disc levels: C1-2: Unremarkable. C2-3: Small disc bulge with bilateral uncovertebral hypertrophy. There is no spinal canal stenosis. Moderate right and severe left neural foraminal stenosis. C3-4: Anterior fusion. There is no spinal canal stenosis. No neural foraminal stenosis. C4-5: Anterior fusion. There is no spinal canal stenosis. Right asymmetric osseous spurring causes moderate right foraminal stenosis. No left neural foraminal stenosis. C5-6: Limited visualization of the spinal canal due to susceptibility effects from spinal hardware. No visible central spinal canal stenosis. Limited assessment for neural foraminal stenosis. C6-7: Intermediate sized disc osteophyte complex. Moderate spinal canal stenosis. Severe bilateral neural foraminal stenosis. C7-T1: Normal disc space and facet joints. There is no spinal canal stenosis. No neural foraminal stenosis. MRI THORACIC SPINE  FINDINGS Alignment:  Physiologic. Vertebrae: No fracture, evidence of discitis, or bone lesion. Mild degenerative height loss at T4. No abnormal contrast enhancement.  Cord:  Normal signal and morphology.  No epidural abnormality. Paraspinal and other soft tissues: Negative. Disc levels: There is mild multilevel degenerative disc disease but no spinal canal stenosis or neural impingement. MRI LUMBAR SPINE FINDINGS Segmentation: Transitional anatomy with lowest disc space labeled as S1-S2, unchanged. Alignment:  Physiologic. Vertebrae: Unchanged appearance of compression fractures at L1 and L3 compared to MRI from 07/19/2023. No new compression fracture. Conus medullaris and cauda equina: Conus extends to the L2 level. Conus and cauda equina appear normal. No abnormal epidural contrast enhancement. Paraspinal and other soft tissues: Negative. Disc levels: L1-L2: Small disc bulge. No spinal canal stenosis. No neural foraminal stenosis. L2-L3: Small disc bulge. No spinal canal stenosis. No neural foraminal stenosis. L3-L4: Minimal disc bulge and mild facet hypertrophy. No spinal canal stenosis. No neural foraminal stenosis. L4-L5: Small left asymmetric disc bulge. No spinal canal stenosis. Mild left neural foraminal stenosis. L5-S1: Intermediate sized disc bulge with moderate facet arthrosis. Narrowing of both lateral recesses without central spinal canal stenosis. No neural foraminal stenosis. S1-S2: Normal disc space. No spinal canal or neural foraminal stenosis. Visualized sacrum: Normal. IMPRESSION: 1. No acute abnormality of the cervical, thoracic or lumbar spine. No discitis-osteomyelitis or epidural abscess. 2. Atrophic appearance of the cervical spinal cord at the C3-5 levels. 3. Moderate C6-7 spinal canal stenosis and severe bilateral neural foraminal stenosis. 4. Moderate right and severe left C2-3 neural foraminal stenosis. 5. Unchanged appearance of compression fractures at L1 and L3 compared to MRI from  07/19/2023. No new compression fracture. 6. Narrowing of both lateral recesses at L5-S1 without central spinal canal stenosis. Electronically Signed   By: Deatra Robinson M.D.   On: 07/27/2023 02:19   MR THORACIC SPINE W WO CONTRAST  Result Date: 07/27/2023 CLINICAL DATA:  Back pain with bilateral lower extremity weakness. EXAM: MRI CERVICAL, THORACIC AND LUMBAR SPINE WITHOUT AND WITH CONTRAST TECHNIQUE: Multiplanar and multiecho pulse sequences of the cervical spine, to include the craniocervical junction and cervicothoracic junction, and thoracic and lumbar spine, were obtained without and with intravenous contrast. CONTRAST:  7mL GADAVIST GADOBUTROL 1 MMOL/ML IV SOLN COMPARISON:  Cervical spine MRI 03/08/2013 Cervical spine CT 04/27/2023 Lumbar spine MRI 07/19/2023 FINDINGS: MRI CERVICAL SPINE FINDINGS Alignment: Physiologic. Vertebrae: Anterior fusion hardware spans the C3-5 levels. There is solid anterior arthrodesis. There is also osseous fusion across the C5-6 disc space. No acute fracture or other focal osseous lesion. Cord: Small caliber spinal cord noted at the C3-5 levels. No spinal cord signal abnormality. Posterior Fossa, vertebral arteries, paraspinal tissues: Vertebral artery flow voids are normal. Normal posterior fossa. Disc levels: C1-2: Unremarkable. C2-3: Small disc bulge with bilateral uncovertebral hypertrophy. There is no spinal canal stenosis. Moderate right and severe left neural foraminal stenosis. C3-4: Anterior fusion. There is no spinal canal stenosis. No neural foraminal stenosis. C4-5: Anterior fusion. There is no spinal canal stenosis. Right asymmetric osseous spurring causes moderate right foraminal stenosis. No left neural foraminal stenosis. C5-6: Limited visualization of the spinal canal due to susceptibility effects from spinal hardware. No visible central spinal canal stenosis. Limited assessment for neural foraminal stenosis. C6-7: Intermediate sized disc osteophyte complex.  Moderate spinal canal stenosis. Severe bilateral neural foraminal stenosis. C7-T1: Normal disc space and facet joints. There is no spinal canal stenosis. No neural foraminal stenosis. MRI THORACIC SPINE FINDINGS Alignment:  Physiologic. Vertebrae: No fracture, evidence of discitis, or bone lesion. Mild degenerative height loss at T4. No abnormal contrast enhancement. Cord:  Normal signal and morphology.  No epidural  abnormality. Paraspinal and other soft tissues: Negative. Disc levels: There is mild multilevel degenerative disc disease but no spinal canal stenosis or neural impingement. MRI LUMBAR SPINE FINDINGS Segmentation: Transitional anatomy with lowest disc space labeled as S1-S2, unchanged. Alignment:  Physiologic. Vertebrae: Unchanged appearance of compression fractures at L1 and L3 compared to MRI from 07/19/2023. No new compression fracture. Conus medullaris and cauda equina: Conus extends to the L2 level. Conus and cauda equina appear normal. No abnormal epidural contrast enhancement. Paraspinal and other soft tissues: Negative. Disc levels: L1-L2: Small disc bulge. No spinal canal stenosis. No neural foraminal stenosis. L2-L3: Small disc bulge. No spinal canal stenosis. No neural foraminal stenosis. L3-L4: Minimal disc bulge and mild facet hypertrophy. No spinal canal stenosis. No neural foraminal stenosis. L4-L5: Small left asymmetric disc bulge. No spinal canal stenosis. Mild left neural foraminal stenosis. L5-S1: Intermediate sized disc bulge with moderate facet arthrosis. Narrowing of both lateral recesses without central spinal canal stenosis. No neural foraminal stenosis. S1-S2: Normal disc space. No spinal canal or neural foraminal stenosis. Visualized sacrum: Normal. IMPRESSION: 1. No acute abnormality of the cervical, thoracic or lumbar spine. No discitis-osteomyelitis or epidural abscess. 2. Atrophic appearance of the cervical spinal cord at the C3-5 levels. 3. Moderate C6-7 spinal canal  stenosis and severe bilateral neural foraminal stenosis. 4. Moderate right and severe left C2-3 neural foraminal stenosis. 5. Unchanged appearance of compression fractures at L1 and L3 compared to MRI from 07/19/2023. No new compression fracture. 6. Narrowing of both lateral recesses at L5-S1 without central spinal canal stenosis. Electronically Signed   By: Deatra Robinson M.D.   On: 07/27/2023 02:19   MR Lumbar Spine W Wo Contrast  Result Date: 07/27/2023 CLINICAL DATA:  Back pain with bilateral lower extremity weakness. EXAM: MRI CERVICAL, THORACIC AND LUMBAR SPINE WITHOUT AND WITH CONTRAST TECHNIQUE: Multiplanar and multiecho pulse sequences of the cervical spine, to include the craniocervical junction and cervicothoracic junction, and thoracic and lumbar spine, were obtained without and with intravenous contrast. CONTRAST:  7mL GADAVIST GADOBUTROL 1 MMOL/ML IV SOLN COMPARISON:  Cervical spine MRI 03/08/2013 Cervical spine CT 04/27/2023 Lumbar spine MRI 07/19/2023 FINDINGS: MRI CERVICAL SPINE FINDINGS Alignment: Physiologic. Vertebrae: Anterior fusion hardware spans the C3-5 levels. There is solid anterior arthrodesis. There is also osseous fusion across the C5-6 disc space. No acute fracture or other focal osseous lesion. Cord: Small caliber spinal cord noted at the C3-5 levels. No spinal cord signal abnormality. Posterior Fossa, vertebral arteries, paraspinal tissues: Vertebral artery flow voids are normal. Normal posterior fossa. Disc levels: C1-2: Unremarkable. C2-3: Small disc bulge with bilateral uncovertebral hypertrophy. There is no spinal canal stenosis. Moderate right and severe left neural foraminal stenosis. C3-4: Anterior fusion. There is no spinal canal stenosis. No neural foraminal stenosis. C4-5: Anterior fusion. There is no spinal canal stenosis. Right asymmetric osseous spurring causes moderate right foraminal stenosis. No left neural foraminal stenosis. C5-6: Limited visualization of the  spinal canal due to susceptibility effects from spinal hardware. No visible central spinal canal stenosis. Limited assessment for neural foraminal stenosis. C6-7: Intermediate sized disc osteophyte complex. Moderate spinal canal stenosis. Severe bilateral neural foraminal stenosis. C7-T1: Normal disc space and facet joints. There is no spinal canal stenosis. No neural foraminal stenosis. MRI THORACIC SPINE FINDINGS Alignment:  Physiologic. Vertebrae: No fracture, evidence of discitis, or bone lesion. Mild degenerative height loss at T4. No abnormal contrast enhancement. Cord:  Normal signal and morphology.  No epidural abnormality. Paraspinal and other soft tissues: Negative. Disc levels:  There is mild multilevel degenerative disc disease but no spinal canal stenosis or neural impingement. MRI LUMBAR SPINE FINDINGS Segmentation: Transitional anatomy with lowest disc space labeled as S1-S2, unchanged. Alignment:  Physiologic. Vertebrae: Unchanged appearance of compression fractures at L1 and L3 compared to MRI from 07/19/2023. No new compression fracture. Conus medullaris and cauda equina: Conus extends to the L2 level. Conus and cauda equina appear normal. No abnormal epidural contrast enhancement. Paraspinal and other soft tissues: Negative. Disc levels: L1-L2: Small disc bulge. No spinal canal stenosis. No neural foraminal stenosis. L2-L3: Small disc bulge. No spinal canal stenosis. No neural foraminal stenosis. L3-L4: Minimal disc bulge and mild facet hypertrophy. No spinal canal stenosis. No neural foraminal stenosis. L4-L5: Small left asymmetric disc bulge. No spinal canal stenosis. Mild left neural foraminal stenosis. L5-S1: Intermediate sized disc bulge with moderate facet arthrosis. Narrowing of both lateral recesses without central spinal canal stenosis. No neural foraminal stenosis. S1-S2: Normal disc space. No spinal canal or neural foraminal stenosis. Visualized sacrum: Normal. IMPRESSION: 1. No acute  abnormality of the cervical, thoracic or lumbar spine. No discitis-osteomyelitis or epidural abscess. 2. Atrophic appearance of the cervical spinal cord at the C3-5 levels. 3. Moderate C6-7 spinal canal stenosis and severe bilateral neural foraminal stenosis. 4. Moderate right and severe left C2-3 neural foraminal stenosis. 5. Unchanged appearance of compression fractures at L1 and L3 compared to MRI from 07/19/2023. No new compression fracture. 6. Narrowing of both lateral recesses at L5-S1 without central spinal canal stenosis. Electronically Signed   By: Deatra Robinson M.D.   On: 07/27/2023 02:19   DG Lumbar Spine Complete  Result Date: 07/27/2023 CLINICAL DATA:  L3 compression fracture EXAM: LUMBAR SPINE - COMPLETE 4+ VIEW COMPARISON:  07/19/2023 MRI FINDINGS: Stable compression deformity of L1 and L3 are seen. Facet hypertrophic changes are noted. Flexion and extension views show no significant instability. Aortic calcifications are seen. No soft tissue abnormality is noted. IMPRESSION: L1 and L3 compression deformities stable from the prior exam. No instability on flexion and extension is noted. Electronically Signed   By: Alcide Clever M.D.   On: 07/27/2023 01:07   DG Pelvis 1-2 Views  Result Date: 07/27/2023 CLINICAL DATA:  Bilateral hip pain, initial encounter EXAM: PELVIS - 1-2 VIEW COMPARISON:  07/27/2018 FINDINGS: Pelvic ring is intact. Mild degenerative changes of the hip joints are seen. Dystrophic bony formation along the lateral left iliac wing is noted. This is stable from the prior exam. No soft tissue abnormality is seen. IMPRESSION: Chronic changes without acute abnormality. Electronically Signed   By: Alcide Clever M.D.   On: 07/27/2023 01:05   CT HEAD WO CONTRAST ( )  Result Date: 07/27/2023 CLINICAL DATA:  Dysphagia, unexplained LE weakness EXAM: CT HEAD WITHOUT CONTRAST TECHNIQUE: Contiguous axial images were obtained from the base of the skull through the vertex without  intravenous contrast. RADIATION DOSE REDUCTION: This exam was performed according to the departmental dose-optimization program which includes automated exposure control, adjustment of the mA and/or kV according to patient size and/or use of iterative reconstruction technique. COMPARISON:  CT head 04/27/2023 FINDINGS: Brain: Cerebral ventricle sizes are concordant with the degree of cerebral volume loss. Patchy and confluent areas of decreased attenuation are noted throughout the deep and periventricular white matter of the cerebral hemispheres bilaterally, compatible with chronic microvascular ischemic disease. no evidence of large-territorial acute infarction. No parenchymal hemorrhage. No mass lesion. No extra-axial collection. No mass effect or midline shift. No hydrocephalus. Basilar cisterns are patent. Vascular: No  hyperdense vessel. Atherosclerotic calcifications are present within the cavernous internal carotid arteries. Skull: No acute fracture or focal lesion. Sinuses/Orbits: Paranasal sinuses and mastoid air cells are clear. The orbits are unremarkable. Other: None. IMPRESSION: No acute intracranial abnormality. Electronically Signed   By: Tish Frederickson M.D.   On: 07/27/2023 00:14   DG Cervical Spine 2-3 Views  Result Date: 07/26/2023 CLINICAL DATA:  Neck pain EXAM: CERVICAL SPINE - 3 VIEW COMPARISON:  06/11/2013 FINDINGS: Seven cervical segments are well visualized. Postsurgical changes are noted from C3-C5 similar to that seen on the prior exam. Osteophytic changes are noted at C6-C7. No prevertebral soft tissue abnormality is noted. The odontoid is within normal limits. Carotid calcifications are seen. IMPRESSION: Postsurgical changes stable from the prior exam. No acute abnormality noted. Electronically Signed   By: Alcide Clever M.D.   On: 07/26/2023 21:43   DG Thoracic Spine 2 View  Result Date: 07/26/2023 CLINICAL DATA:  pain EXAM: THORACIC SPINE 2 VIEWS COMPARISON:  CT abdomen pelvis  07/27/2018 trauma CT chest 01/01/2013 FINDINGS: Limited evaluation due to overlapping osseous structures and overlying soft tissues. There is no evidence of thoracic spine fracture. Alignment is normal. No other significant bone abnormalities are identified. Punctate calcifications overlying the right lower lung zone and to a lesser extent left lower lung zone. Atherosclerotic plaque. IMPRESSION: 1. No acute displaced fracture or traumatic listhesis of the thoracic spine. Limited evaluation due to overlapping osseous structures and overlying soft tissues. 2.  Aortic Atherosclerosis (ICD10-I70.0). Electronically Signed   By: Tish Frederickson M.D.   On: 07/26/2023 21:42     Data Reviewed: Relevant notes from primary care and specialist visits, past discharge summaries as available in EHR, including Care Everywhere. Prior diagnostic testing as pertinent to current admission diagnoses Updated medications and problem lists for reconciliation ED course, including vitals, labs, imaging, treatment and response to treatment Triage notes, nursing and pharmacy notes and ED provider's notes Notable results as noted in HPI  Assessment and Plan: * Bilateral leg weakness Differentials include spinal etiology or cerebral etiology. Will obtain head CT and left the brain. MRI of the cervical thoracic and lumbar spine has been ordered and reports are available showing  IMPRESSION: 1. No acute abnormality of the cervical, thoracic or lumbar spine. No discitis-osteomyelitis or epidural abscess. 2. Atrophic appearance of the cervical spinal cord at the C3-5 levels. 3. Moderate C6-7 spinal canal stenosis and severe bilateral neural foraminal stenosis. 4. Moderate right and severe left C2-3 neural foraminal stenosis. 5. Unchanged appearance of compression fractures at L1 and L3 compared to MRI from 07/19/2023. No new compression fracture. 6. Narrowing of both lateral recesses at L5-S1 without central spinal canal  stenosis. Neurosurgery has been consulted and is going to be following the case.  Physical therapy as deemed appropriate once we have the etiology. Aspiration and fall precautions in the meantime. B12 level . ? Hip arthritis.  ? DVT dimer is pending.   Coronary artery disease involving native coronary artery of native heart with angina pectoris with documented spasm (HCC) No reports of any chest pain. Will continue patient on his aspirin and hold his atorvastatin. CPK is pending.  D-dimer is pending,  Anemia    Latest Ref Rng & Units 07/26/2023    3:52 PM 07/19/2023    4:46 PM 04/27/2023    9:48 AM  CBC  WBC 4.0 - 10.5 K/uL 8.9  8.1  9.9   Hemoglobin 13.0 - 17.0 g/dL 24.4  01.0  12.5  Hematocrit 39.0 - 52.0 % 38.4  38.2  37.5   Platelets 150 - 400 K/uL 373  222  256   Mild stable. Will follow.    Benign essential hypertension Vitals:   07/26/23 1531 07/26/23 2335  BP: 107/66 132/74  No antihypertensive therapy as far as home meds resume if any once med rec is available.     DVT prophylaxis:  Heparin   Consults:  Neurosurgery: Dr. Myer Haff.  Advance Care Planning:    Code Status: Full Code   Family Communication:  Daughter susan :[phone]  Disposition Plan:  TBD.  Severity of Illness: The appropriate patient status for this patient is INPATIENT. Inpatient status is judged to be reasonable and necessary in order to provide the required intensity of service to ensure the patient's safety. The patient's presenting symptoms, physical exam findings, and initial radiographic and laboratory data in the context of their chronic comorbidities is felt to place them at high risk for further clinical deterioration. Furthermore, it is not anticipated that the patient will be medically stable for discharge from the hospital within 2 midnights of admission.   * I certify that at the point of admission it is my clinical judgment that the patient will require inpatient  hospital care spanning beyond 2 midnights from the point of admission due to high intensity of service, high risk for further deterioration and high frequency of surveillance required.*  Author: Gertha Calkin, MD 07/27/2023 2:31 AM  For on call review [email or web address].

## 2023-07-27 NOTE — Assessment & Plan Note (Addendum)
Holding medications

## 2023-07-27 NOTE — Progress Notes (Signed)
Progress Note   Patient: Patrick Malone QMV:[id number] DOB: Oct 07, 1931 DOA: 07/26/2023     0 DOS: the patient was seen and examined on 07/27/2023   Brief hospital course: 89 year old man past medical history of hypertension, heart disease, osteoarthritis, glaucoma presents with weakness and inability to ambulate and pain in his right groin and hip area.  Patient also coughed when doing bedside swallow evaluation and patient also failed modified barium swallow.  MRI cervical, thoracic and lumbar spine reviewed.  Assessment and Plan: * Failure to thrive in adult Patient did not do well with modified swallow evaluation.  Case discussed with patient and daughter.  Will get palliative care consultation to help talk through next steps.  Most aggressive step would be neurosurgical evaluation for the osteophytes of his cervical spine which may not help his issue.  Mention possibility of feeding tube which would likely have to be a surgical procedure.  Also mentioned eating at his own risk risk, knowing that he is a high risk for aspiration.  Continue IV fluids for now n.p.o. for right now.  Bilateral leg weakness PT and OT consultations.  Coronary artery disease involving native coronary artery of native heart with angina pectoris with documented spasm (HCC) No complaints of chest pain.  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 status, not sure if we will need a procedure or not will hold anticoagulation for right now.  Will discuss more with patient about anticoagulation tomorrow.  Closed wedge compression fracture of third lumbar vertebra (HCC) Continue to monitor with physical therapy.  Cervical spondylosis Neurosurgical following  Benign essential hypertension Holding medications        Subjective: Patient coming in with a lot of pains especially in his right groin and hip area.  Had MRI of  entire spine.  Had trouble swallowing and did not do well with the modified barium test.  Physical Exam: Vitals:   07/27/23 1155 07/27/23 1156 07/27/23 1505 07/27/23 1612  BP: 126/65  (!) 141/70 137/69  Pulse: 72  85 92  Resp: 19  19 16   Temp:  97.7 F (36.5 C) 97.6 F (36.4 C) 97.8 F (36.6 C)  TempSrc:  Oral Oral Oral  SpO2: 100%  100% 94%  Height:       Physical Exam HENT:     Head: Normocephalic.     Mouth/Throat:     Pharynx: No oropharyngeal exudate.  Eyes:     General: Lids are normal.     Conjunctiva/sclera: Conjunctivae normal.  Cardiovascular:     Rate and Rhythm: Normal rate and regular rhythm.     Heart sounds: Normal heart sounds, S1 normal and S2 normal.  Pulmonary:     Breath sounds: No decreased breath sounds, wheezing, rhonchi or rales.  Abdominal:     Palpations: Abdomen is soft.     Tenderness: There is no abdominal tenderness.  Musculoskeletal:     Right lower leg: No swelling.     Left lower leg: No swelling.  Skin:    General: Skin is warm.     Findings: No rash.  Neurological:     Mental Status: He is alert and oriented to person, place, and time.     Comments: Patient needed help to stand up with me.     Data Reviewed: Creatinine 0.9, vitamin D 35.47, hemoglobin 11 Ultrasound bilateral lower extremity shows a chronic DVT Pelvic x-ray negative MRI entire  spine shows no acute abnormality cervical thoracic or lumbar spine atrophic appearance of the cervical spinal cord at C3 3 through 5 moderate spinal canal stenosis at C6-7 and severe bilateral neuroforaminal foraminal stenosis, moderate right and severe left C2-3 foraminal stenosis, unchanged compression fractures L1 and L3    Family Communication: Spoke with daughter at the bedside twice  Disposition: Status is: Observation Failed swallow evaluation.  Need to figure out next steps of care.  Palliative care consultation.  Case discussed with neurosurgery about osteophytes pushing on his  esophagus.  Planned Discharge Destination: Home with Home Health    Time spent: 28 minutes  Author: Alford Highland, MD 07/27/2023 5:14 PM  For on call review [email or web address].

## 2023-07-27 NOTE — ED Notes (Signed)
Venous Ultrasound tech at patient bedside.

## 2023-07-27 NOTE — Assessment & Plan Note (Signed)
Differentials include spinal etiology or cerebral etiology. Will obtain head CT and left the brain. MRI of the cervical thoracic and lumbar spine has been ordered and reports are pending. Neurosurgery has been consulted and is going to be following the case.  Physical therapy as deemed appropriate once we have the etiology. Aspiration and fall precautions in the meantime.

## 2023-07-27 NOTE — Procedures (Signed)
Modified Barium Swallow Study  Patient Details  Name: Patrick Malone MRN: [id number] Date of Birth: 04/18/1932  Today's Date: 07/27/2023  Modified Barium Swallow completed.  Full report located under Chart Review in the Imaging Section.  History of Present Illness Pt is a 89 year old male with past medical history of surgical repair of C3-C5 and recent Cervical Spine CT noting "Postsurgical changes are noted from C3-C5 similar to that seen on the prior exam. Osteophytic changes are noted at C6-C7." MRI of cervical spine also revealed "C6-7: Intermediate sized disc osteophyte complex" (07/26/2023). Pt reports history of esophageal damage during cervical surgery in 1984 with periodic dysphagia as a result. Of note, pt had a Barium Swallow revealed Patrick Malone aspiration barium into the trachea, right and left mainstem bronchi (05/02/20220).   Clinical Impression Pt presents with the appearance of anterior osteophytes at C6-C7 (also confirmed by DG Cervical Spine 07/26/2023 and MRI of cervical spine C6-7: Intermediate sized disc osteophyte complex. When consuming thin liquids via spoon, nectar thick liquids via spoon and puree, very little of each bolus transits thru the UES d/t likely impedence of the osteophytes. Every with 6-7 swallows per bolus, pt is not able to clear the pyriform sinuses of residue with eventual sensed aspiration of all consistencies. Multiple positional modifications were trialed (head turns, head tilts and chin tuck) with no increased ability. At this time, continue to recommend strict NPO as pt is at a very high risk of aspiration with all PO consumption.   Factors that may increase risk of adverse event in presence of aspiration Patrick Malone & Patrick Malone 2021): Limited mobility;Frail or deconditioned;Frequent aspiration of large volumes  Swallow Evaluation Recommendations Recommendations: NPO Medication Administration: Via alternative means Oral care recommendations: Oral care QID  (4x/day) Recommended consults: Consider Palliative care    Patrick Malone B. Patrick Malone, M.S., CCC-SLP, Tree surgeon Certified Brain Injury Specialist [facility]  [facility] Office [phone] Ascom [phone] Fax [phone]

## 2023-07-28 DIAGNOSIS — E785 Hyperlipidemia, unspecified: Secondary | ICD-10-CM | POA: Diagnosis present

## 2023-07-28 DIAGNOSIS — R29898 Other symptoms and signs involving the musculoskeletal system: Secondary | ICD-10-CM | POA: Diagnosis present

## 2023-07-28 DIAGNOSIS — Z85828 Personal history of other malignant neoplasm of skin: Secondary | ICD-10-CM | POA: Diagnosis not present

## 2023-07-28 DIAGNOSIS — M47812 Spondylosis without myelopathy or radiculopathy, cervical region: Secondary | ICD-10-CM | POA: Diagnosis present

## 2023-07-28 DIAGNOSIS — E162 Hypoglycemia, unspecified: Secondary | ICD-10-CM | POA: Diagnosis present

## 2023-07-28 DIAGNOSIS — D649 Anemia, unspecified: Secondary | ICD-10-CM | POA: Diagnosis present

## 2023-07-28 DIAGNOSIS — Z7189 Other specified counseling: Secondary | ICD-10-CM | POA: Diagnosis not present

## 2023-07-28 DIAGNOSIS — I82512 Chronic embolism and thrombosis of left femoral vein: Secondary | ICD-10-CM | POA: Diagnosis present

## 2023-07-28 DIAGNOSIS — Z681 Body mass index (BMI) 19 or less, adult: Secondary | ICD-10-CM | POA: Diagnosis not present

## 2023-07-28 DIAGNOSIS — I25111 Atherosclerotic heart disease of native coronary artery with angina pectoris with documented spasm: Secondary | ICD-10-CM | POA: Diagnosis present

## 2023-07-28 DIAGNOSIS — M4856XA Collapsed vertebra, not elsewhere classified, lumbar region, initial encounter for fracture: Secondary | ICD-10-CM | POA: Diagnosis present

## 2023-07-28 DIAGNOSIS — R627 Adult failure to thrive: Secondary | ICD-10-CM | POA: Diagnosis present

## 2023-07-28 DIAGNOSIS — Z881 Allergy status to other antibiotic agents status: Secondary | ICD-10-CM | POA: Diagnosis not present

## 2023-07-28 DIAGNOSIS — H409 Unspecified glaucoma: Secondary | ICD-10-CM | POA: Diagnosis present

## 2023-07-28 DIAGNOSIS — Z515 Encounter for palliative care: Secondary | ICD-10-CM | POA: Diagnosis not present

## 2023-07-28 DIAGNOSIS — Z955 Presence of coronary angioplasty implant and graft: Secondary | ICD-10-CM | POA: Diagnosis not present

## 2023-07-28 DIAGNOSIS — I10 Essential (primary) hypertension: Secondary | ICD-10-CM | POA: Diagnosis present

## 2023-07-28 DIAGNOSIS — Z79899 Other long term (current) drug therapy: Secondary | ICD-10-CM | POA: Diagnosis not present

## 2023-07-28 DIAGNOSIS — F17210 Nicotine dependence, cigarettes, uncomplicated: Secondary | ICD-10-CM | POA: Diagnosis present

## 2023-07-28 DIAGNOSIS — I7300 Raynaud's syndrome without gangrene: Secondary | ICD-10-CM | POA: Diagnosis present

## 2023-07-28 DIAGNOSIS — Z7982 Long term (current) use of aspirin: Secondary | ICD-10-CM | POA: Diagnosis not present

## 2023-07-28 DIAGNOSIS — M2578 Osteophyte, vertebrae: Secondary | ICD-10-CM | POA: Diagnosis present

## 2023-07-28 DIAGNOSIS — G8929 Other chronic pain: Secondary | ICD-10-CM | POA: Diagnosis present

## 2023-07-28 DIAGNOSIS — I252 Old myocardial infarction: Secondary | ICD-10-CM | POA: Diagnosis not present

## 2023-07-28 DIAGNOSIS — Z8249 Family history of ischemic heart disease and other diseases of the circulatory system: Secondary | ICD-10-CM | POA: Diagnosis not present

## 2023-07-28 DIAGNOSIS — M1611 Unilateral primary osteoarthritis, right hip: Secondary | ICD-10-CM | POA: Diagnosis present

## 2023-07-28 MED ORDER — ACETAMINOPHEN 325 MG PO TABS: 650.0000 mg | ORAL_TABLET | Freq: Four times a day (QID) | ORAL | Status: DC | PRN

## 2023-07-28 MED ORDER — ACETAMINOPHEN 650 MG RE SUPP: 650.0000 mg | Freq: Four times a day (QID) | RECTAL | Status: DC | PRN

## 2023-07-28 NOTE — Plan of Care (Signed)

## 2023-07-28 NOTE — Consult Note (Addendum)
Consultation Note Date: 07/28/2023   Patient Name: Patrick Malone  DOB: 24-Feb-1932  MRN: [id number]  Age / Sex: 89 y.o., male  PCP: Dorothey Baseman, MD Referring Physician: Alford Highland, MD  Reason for Consultation: Establishing goals of care  HPI/Patient Profile: Patrick Malone is a 89 y.o. male with medical history significant for hypertension, heart disease, osteoarthritis, glaucoma presenting with lower leg weakness inability to ambulate his legs giving out on him, bilateral hip pain, all since the past few days patient follows with neurosurgery Dr. Osborne [facility] after recent trauma to his back and recommended he come in and evaluation with repeat MRI for concerns of spinal stenosis cord compression, MRI has been done in the emergency room reports are still pending.  At bedside when I am evaluating the patient he is reporting dysphagia with new worsening over the past few days, difficulty with abducting both his legs and therefore ambulating, low back pain severe back pain.  Daughter at bedside.  Discussed with daughter that his clinical scenario also warrants that we evaluate him for CVA and rule that out also that he may have could be an infection could be a spinal issue.  Another differential is also DVTs as he 2+ bilateral pitting edema.   Clinical Assessment and Goals of Care: Notes and labs reviewed.  In to see patient.  Patient's daughter is at bedside.  They discuss the updates that they have received and are able to correctly articulate his status and decisions to be made.  They discuss that patient is married has children.  They discuss that patient has been having varying degrees of dysphagia since his neck surgery back in the 1980s.  They discuss all that over the past few weeks his status has declined, he has had a lot more coughing and has been more weak.  They discussed ongoing  conversations regarding a PEG tube, anterior osteophyte removal, and kyphoplasty.  Wife and other daughter joined conversation.  Patient discusses that his ability to eat and drink are extremely important to his quality of life.  He advises his ability to mobilize is also extremely important as well.   We discussed his diagnoses, prognosis, GOC, EOL wishes disposition and options.  Created space and opportunity for patient  to explore thoughts and feelings regarding current medical information.   A detailed discussion was had today regarding advanced directives.  Concepts specific to code status, artifical feeding and hydration, IV antibiotics and rehospitalization were discussed.  The difference between an aggressive medical intervention path and a comfort care path was discussed.  Values and goals of care important to patient and family were attempted to be elicited.  Discussed limitations of medical interventions to prolong quality of life in some situations and discussed the concept of human mortality.  Patient advises that at times he will "cough up" food that he ate days prior.  He states at times he has issues with swallowing his saliva, and on occasion has had difficulty with swallowing water.  He discusses acceptable quality  of life would not include ongoing dysphagia symptoms.    Discussed various scenarios.  He states he is aware of the risk for anterior osteophyte removal surgery, and does not believe he wants to proceed with this.  He states the medications that he is receiving for pain is working well for his back pain and so he would not want kyphoplasty.  Patient articulates that he would not want a PEG tube placed as this would only serve to prolong time on this earth and not quality of life; he states a feeding tube would prolong suffering.  He states he wants to eat and drink as he is able, and discusses that God is in control of the outcomes.   His wife and children would like to  speak with him privately.  Discussed that PMT would follow-up tomorrow.  Discussed that if he would like to go home with hospice, staff and the attending team can engage hospice liaison to facilitate this.      SUMMARY OF RECOMMENDATIONS   Family would like to speak with patient regarding decisions moving forward.  Prognosis:  poor       Primary Diagnoses: Present on Admission:  Coronary artery disease involving native coronary artery of native heart with angina pectoris with documented spasm (HCC)  Benign essential hypertension  Bilateral leg weakness   I have reviewed the medical record, interviewed the patient and family, and examined the patient. The following aspects are pertinent.  Past Medical History:  Diagnosis Date   Actinic keratosis    Basal cell carcinoma 02/24/2021   R nose, EDC 03/24/21   Clotting disorder (HCC)    Coronary artery disease    a. 02/2012 PCI ([facility]): OM1 99% (2.5x18 Resolute DES).   ED (erectile dysfunction)    Glaucoma    Hyperlipidemia    Hypertension    Hypotestosteronism    MI (myocardial infarction) (HCC)    Osteoarthritis    Raynaud's disease    Skin cancer years ago   forehead - treated by Dr Jarold Motto   Squamous cell carcinoma of skin 07/13/2022   L lat upper arm, scheduled for [facility]   SVT (supraventricular tachycardia) (HCC)    Syncope and collapse    Social History   Socioeconomic History   Marital status: Married    Spouse name: Not on file   Number of children: Not on file   Years of education: Not on file   Highest education level: Not on file  Occupational History   Not on file  Tobacco Use   Smoking status: Former    Current packs/day: 1.00    Average packs/day: 1 pack/day for 10.0 years (10.0 ttl pk-yrs)    Types: Cigarettes   Smokeless tobacco: Never  Vaping Use   Vaping status: Never Used  Substance and Sexual Activity   Alcohol use: No   Drug use: No   Sexual activity: Not Currently  Other Topics Concern    Not on file  Social History Narrative   Not on file   Social Determinants of Health   Financial Resource Strain: Low Risk  (11/14/2022)   Received from [facility], [facility]   Overall Financial Resource Strain (CARDIA)    Difficulty of Paying Living Expenses: Not very hard  Food Insecurity: No Food Insecurity (07/27/2023)   Hunger Vital Sign    Worried About Running Out of Food in the Last Year: Never true    Ran Out of Food in the  Last Year: Never true  Transportation Needs: No Transportation Needs (07/27/2023)   PRAPARE - Administrator, Civil Service (Medical): No    Lack of Transportation (Non-Medical): No  Physical Activity: Not on file  Stress: Not on file  Social Connections: Not on file   Family History  Problem Relation Age of Onset   Heart attack Mother 64   Scheduled Meds:  aspirin  300 mg Rectal Once   heparin  5,000 Units Subcutaneous Q8H   pantoprazole (PROTONIX) IV  40 mg Intravenous Q12H   sodium chloride flush  3 mL Intravenous Q12H   timolol  1 drop Both Eyes Daily   Continuous Infusions:  sodium chloride 50 mL/hr at 07/27/23 2138   PRN Meds:.acetaminophen **OR** acetaminophen, HYDROcodone-acetaminophen, morphine injection Medications Prior to Admission:  Prior to Admission medications   Medication Sig Start Date End Date Taking? Authorizing Provider  acetaminophen (TYLENOL) 325 MG tablet Take 650 mg by mouth every 6 (six) hours as needed.   Yes [provider]  aspirin 81 MG chewable tablet Chew 81 mg by mouth daily.    Yes [provider]  atorvastatin (LIPITOR) 40 MG tablet Take 1 tablet (40 mg total) by mouth daily. 07/17/23 07/11/24 Yes Gollan, Tollie Pizza, MD  fluorouracil (EFUDEX) 5 % cream Apply topically 2 (two) times daily. Apply for 7 days to scalp and forehead. 09/02/21  Yes Deirdre Evener, MD  lactobacillus acidophilus (BACID) TABS tablet Take 2 tablets by mouth 3 (three)  times daily.   Yes [provider]  oxyCODONE-acetaminophen (PERCOCET) 5-325 MG tablet Take 1 tablet by mouth every 6 (six) hours as needed for severe pain (pain score 7-10). 07/19/23 07/18/24 Yes Phineas Semen, MD  timolol (TIMOPTIC) 0.5 % ophthalmic solution Place 1 drop into both eyes daily.  02/05/14  Yes [provider]  Vibegron (GEMTESA) 75 MG TABS Take 1 tablet (75 mg total) by mouth daily. 07/18/23  Yes Vaillancourt, Samantha, PA-C  zinc gluconate 50 MG tablet Take 50 mg by mouth daily.   Yes [provider]   Allergies  Allergen Reactions   Cefazolin Diarrhea and Rash   Clindamycin Diarrhea and Rash   Review of Systems  Constitutional:  Positive for fatigue.  HENT:  Positive for trouble swallowing.   Neurological:  Positive for weakness.    Physical Exam Pulmonary:     Effort: Pulmonary effort is normal.  Neurological:     Mental Status: He is alert.     Vital Signs: BP (!) 147/63 (BP Location: Right Arm)   Pulse (!) 109   Temp (!) 97.5 F (36.4 C)   Resp 18   Ht 6' (1.829 m)   Wt 67.2 kg   SpO2 100%   BMI 20.09 kg/m  Pain Scale: 0-10   Pain Score: 0-No pain   SpO2: SpO2: 100 % O2 Device:SpO2: 100 % O2 Flow Rate: .   IO: Intake/output summary:  Intake/Output Summary (Last 24 hours) at 07/28/2023 1441 Last data filed at 07/28/2023 1420 Gross per 24 hour  Intake 309.72 ml  Output 700 ml  Net -390.28 ml    LBM: Last BM Date : 07/27/23 Baseline Weight: Weight: 67.2 kg Most recent weight: Weight: 67.2 kg       Signed by: Morton Stall, NP   Please contact Palliative Medicine Team phone at [phone] for questions and concerns.  For individual provider: See Loretha Stapler

## 2023-07-28 NOTE — Progress Notes (Addendum)
Physical Therapy Treatment Patient Details Name: Patrick Malone MRN: [id number] DOB: 1931/11/19 Today's Date: 07/28/2023   History of Present Illness 89 y.o. male with medical history significant for hypertension, heart disease, osteoarthritis, glaucoma presenting with lower leg weakness inability to ambulate his legs giving out on him, bilateral hip pain, all since the past few days patient follows with neurosurgery after recent trauma to his back (L1 compression fracture with a new L3 burst fracture after a fall in July 2024) and recommended he come in and evaluation with repeat MRI for concerns of spinal stenosis cord compression.    PT Comments  Patient received in bed, daughter at bedside. He is agreeable to PT session. Patient requires just min A with bed mobility to raise trunk. He stands with cga and ambulated 150 feet with RW and cga. No lob or significant difficulty noted. He will continue to benefit from skilled PT to improve strength and functional independence.  Prior goals were met this session, new goals added.   If plan is discharge home, recommend the following: A little help with walking and/or transfers;A little help with bathing/dressing/bathroom;Assist for transportation;Help with stairs or ramp for entrance   Can travel by private vehicle     Yes  Equipment Recommendations  Rolling walker (2 wheels)    Recommendations for Other Services       Precautions / Restrictions Precautions Precautions: Fall Precaution Comments: L1 compression fracture with a new L3 burst fracture after a fall in July 2024. Restrictions Weight Bearing Restrictions: No     Mobility  Bed Mobility Overal bed mobility: Needs Assistance Bed Mobility: Supine to Sit     Supine to sit: Min assist     General bed mobility comments: patient with improved bed mobility requiring only min assist to fully raise trunk to sitting position. No reports of pain    Transfers Overall transfer  level: Needs assistance Equipment used: Rolling walker (2 wheels) Transfers: Sit to/from Stand Sit to Stand: Contact guard assist                Ambulation/Gait Ambulation/Gait assistance: Contact guard assist Gait Distance (Feet): 150 Feet Assistive device: Rolling walker (2 wheels) Gait Pattern/deviations: Step-through pattern, Trunk flexed Gait velocity: slightly decreased     General Gait Details: patient ambulated around nursing station with RW and cga. Good pace, no lob.   Stairs             Wheelchair Mobility     Tilt Bed    Modified Rankin (Stroke Patients Only)       Balance Overall balance assessment: Needs assistance Sitting-balance support: Feet supported Sitting balance-Leahy Scale: Good     Standing balance support: Bilateral upper extremity supported, During functional activity, Reliant on assistive device for balance Standing balance-Leahy Scale: Good Standing balance comment: mild unsteadiness, increased reliance on RW. No overt LOB noted with mobility                            Cognition Arousal: Alert Behavior During Therapy: WFL for tasks assessed/performed Overall Cognitive Status: Within Functional Limits for tasks assessed                                          Exercises      General Comments        Pertinent Vitals/Pain Pain  Assessment Pain Assessment: No/denies pain    Home Living                          Prior Function            PT Goals (current goals can now be found in the care plan section) Acute Rehab PT Goals Patient Stated Goal: get back home PT Goal Formulation: With patient Time For Goal Achievement: 08/10/23 Potential to Achieve Goals: Good Progress towards PT goals: Progressing toward goals    Frequency    Min 1X/week      PT Plan      Co-evaluation              AM-PAC PT "6 Clicks" Mobility   Outcome Measure  Help needed turning from  your back to your side while in a flat bed without using bedrails?: A Little Help needed moving from lying on your back to sitting on the side of a flat bed without using bedrails?: A Little Help needed moving to and from a bed to a chair (including a wheelchair)?: A Little Help needed standing up from a chair using your arms (e.g., wheelchair or bedside chair)?: A Little Help needed to walk in hospital room?: A Little Help needed climbing 3-5 steps with a railing? : A Little 6 Click Score: 18    End of Session Equipment Utilized During Treatment: Gait belt Activity Tolerance: Patient tolerated treatment well Patient left: with family/visitor present;with nursing/sitter in room;Other (comment) (seated on side of bed for bath) Nurse Communication: Mobility status PT Visit Diagnosis: Unsteadiness on feet (R26.81);History of falling (Z91.81);Muscle weakness (generalized) (M62.81)     Time: 6962-9528 PT Time Calculation (min) (ACUTE ONLY): 13 min  Charges:    $Gait Training: 8-22 mins PT General Charges $$ ACUTE PT VISIT: 1 Visit                     Jamera Vanloan, PT, GCS 07/28/23,11:36 AM

## 2023-07-28 NOTE — Progress Notes (Signed)
Progress Note   Patient: Patrick Malone:[id number] DOB: [birth year] DOA: 07/26/2023     0 DOS: the patient was seen and examined on 07/28/2023   Brief hospital course: 89 year old man past medical history of hypertension, heart disease, osteoarthritis, glaucoma presents with weakness and inability to ambulate and pain in his right groin and hip area.  Patient also coughed when doing bedside swallow evaluation and patient also failed modified barium swallow.  MRI cervical, thoracic and lumbar spine reviewed.  10/25.  Patient thinking about options on what to do.  Did not do well with swallow evaluation yesterday.  Considering eating at known risk versus feeding tube versus surgery on his neck for the osteophytes.  Assessment and Plan: * Failure to thrive in adult Patient did not do well with modified swallow evaluation.  Case discussed with patient and daughter.  Palliative care consultation.  Most aggressive step would be neurosurgery for the osteophytes of his cervical spine which may not help his issue.  Mentioned possibility of feeding tube which would likely have to be a surgical procedure.  Also, mentioned eating at his own risk risk, knowing that he is a high risk for aspiration.  Continue IV fluids for now n.p.o. for right now.  Bilateral leg weakness PT and OT consultations.  Coronary artery disease involving native coronary artery of native heart with angina pectoris with documented spasm (HCC) No complaints of chest pain.  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 status, not sure if we will need a procedure or not will hold anticoagulation for right now.  Will continue to discuss with anticoagulation based on what we decide to do.  Closed wedge compression fracture of third lumbar vertebra (HCC) Continue to monitor with physical therapy.  Cervical spondylosis Seen by  neurosurgery.  Benign essential hypertension Holding medications while n.p.o.        Subjective: Patient understands that he did not do well with the swallow evaluation.  He is weighing options of eating at his own risk, versus feeding tube versus surgery for his neck.  No pain in his groin or hip today.  Physical Exam: Vitals:   07/27/23 1505 07/27/23 1612 07/27/23 2325 07/28/23 0500  BP: (!) 141/70 137/69 (!) 147/63   Pulse: 85 92 (!) 109   Resp: 19 16 18    Temp: 97.6 F (36.4 C) 97.8 F (36.6 C) (!) 97.5 F (36.4 C)   TempSrc: Oral Oral    SpO2: 100% 94% 100%   Weight:    67.2 kg  Height:       Physical Exam HENT:     Head: Normocephalic.     Mouth/Throat:     Pharynx: No oropharyngeal exudate.  Eyes:     General: Lids are normal.     Conjunctiva/sclera: Conjunctivae normal.  Cardiovascular:     Rate and Rhythm: Normal rate and regular rhythm.     Heart sounds: Normal heart sounds, S1 normal and S2 normal.  Pulmonary:     Breath sounds: No decreased breath sounds, wheezing, rhonchi or rales.  Abdominal:     Palpations: Abdomen is soft.     Tenderness: There is no abdominal tenderness.  Musculoskeletal:     Right lower leg: No swelling.     Left lower leg: No swelling.  Skin:    General: Skin is warm.     Findings: No rash.  Neurological:     Mental  Status: He is alert and oriented to person, place, and time.     Data Reviewed: No new data today  Family Communication: Spoke with daughter at the bedside  Disposition: Status is: Inpatient Remains inpatient appropriate because: Patient failed swallow evaluation.  Need to figure out the best plan of action.  Patient contemplating eating at own risk versus feeding tube versus neck surgery.  Planned Discharge Destination: Home with Home Health    Time spent: 28 minutes  Author: Alford Highland, MD 07/28/2023 3:03 PM  For on call review [email or web address].

## 2023-07-29 DIAGNOSIS — R29898 Other symptoms and signs involving the musculoskeletal system: Secondary | ICD-10-CM | POA: Diagnosis not present

## 2023-07-29 DIAGNOSIS — R627 Adult failure to thrive: Secondary | ICD-10-CM | POA: Diagnosis not present

## 2023-07-29 DIAGNOSIS — Z515 Encounter for palliative care: Secondary | ICD-10-CM | POA: Diagnosis not present

## 2023-07-29 DIAGNOSIS — I25111 Atherosclerotic heart disease of native coronary artery with angina pectoris with documented spasm: Secondary | ICD-10-CM | POA: Diagnosis not present

## 2023-07-29 DIAGNOSIS — E162 Hypoglycemia, unspecified: Secondary | ICD-10-CM | POA: Insufficient documentation

## 2023-07-29 LAB — BASIC METABOLIC PANEL
Anion gap: 14 (ref 5–15)
BUN: 22 mg/dL (ref 8–23)
CO2: 20 mmol/L — ABNORMAL LOW (ref 22–32)
Calcium: 8.6 mg/dL — ABNORMAL LOW (ref 8.9–10.3)
Chloride: 104 mmol/L (ref 98–111)
Creatinine, Ser: 0.86 mg/dL (ref 0.61–1.24)
GFR, Estimated: >60 mL/min (ref >60)
Glucose, Bld: 51 mg/dL — ABNORMAL LOW (ref 70–99)
Potassium: 4 mmol/L (ref 3.5–5.1)
Sodium: 138 mmol/L (ref 135–145)

## 2023-07-29 LAB — CBC
HCT: 33.6 % — ABNORMAL LOW (ref 39.0–52.0)
Hemoglobin: 10.7 g/dL — ABNORMAL LOW (ref 13.0–17.0)
MCH: 28.2 pg (ref 26.0–34.0)
MCHC: 31.8 g/dL (ref 30.0–36.0)
MCV: 88.7 fL (ref 80.0–100.0)
Platelets: 361 10*3/uL (ref 150–400)
RBC: 3.79 MIL/uL — ABNORMAL LOW (ref 4.22–5.81)
RDW: 15.1 % (ref 11.5–15.5)
WBC: 8.6 10*3/uL (ref 4.0–10.5)
nRBC: 0 % (ref 0.0–0.2)

## 2023-07-29 MED ORDER — MORPHINE SULFATE (CONCENTRATE) 10 MG/0.5ML PO SOLN: 5.0000 mg | ORAL | Status: DC | PRN

## 2023-07-29 MED ORDER — MORPHINE SULFATE (CONCENTRATE) 10 MG/0.5ML PO SOLN: 5.0000 mg | ORAL | 0 refills | Status: DC | PRN

## 2023-07-29 MED ORDER — DEXTROSE 50 % IV SOLN
25.0000 g | Freq: Once | INTRAVENOUS | Status: AC
  Administered 2023-07-29: 25 g via INTRAVENOUS
  Filled 2023-07-29: qty 50

## 2023-07-29 MED ORDER — DEXTROSE-SODIUM CHLORIDE 5-0.9 % IV SOLN: INTRAVENOUS | Status: DC

## 2023-07-29 NOTE — Discharge Summary (Signed)
Physician Discharge Summary   Patient: Patrick Malone MRN: [id number] DOB: 1932-06-15  Admit date:     07/26/2023  Discharge date: 07/29/23  Discharge Physician: Alford Highland   PCP: Dorothey Baseman, MD   Recommendations at discharge:   Follow-up with PCP Follow-up with hospice at home.  Discharge Diagnoses: Principal Problem:   Failure to thrive in adult Active Problems:   Hospice care patient   Coronary artery disease involving native coronary artery of native heart with angina pectoris with documented spasm (HCC)   Bilateral leg weakness   Benign essential hypertension   Cervical spondylosis   Esophageal dysphagia   Closed wedge compression fracture of third lumbar vertebra (HCC)   Chronic deep vein thrombosis (DVT) of left femoral vein (HCC)   Normocytic anemia   Hypoglycemia    Hospital Course: 89 year old man past medical history of hypertension, heart disease, osteoarthritis, glaucoma presents with weakness and inability to ambulate and pain in his right groin and hip area.  Patient also coughed when doing bedside swallow evaluation and patient also failed modified barium swallow.  MRI cervical, thoracic and lumbar spine reviewed.  10/25.  Patient thinking about options on what to do.  Did not do well with swallow evaluation yesterday.  Considering eating at known risk versus feeding tube versus surgery on his neck for the osteophytes. 10/26.  Patient just wants to go home and eat and does not want any invasive procedures.  He is agreeable to hospice.  I talked to him about CODE STATUS and he would like to remain a full code currently.  I told him if he declines and wants to go to the hospice home he will have to be a DNR.  Assessment and Plan: * Failure to thrive in adult Patient did not do well with modified swallow evaluation.  Case discussed with patient and daughter and palliative care.  Patient declined surgery on his neck for the osteophytes.  Patient declined  a feeding tube.  Patient opted to go home with hospice.  Overall prognosis is poor.  He will likely decline quickly with aspiration.  He will likely be unable to keep up with his nutritional needs.  Hospice care patient Patient will go home with hospice.  He wanted to remain a full code at this point but family will decide if he is unable to make a decision.  Hospice can follow-up with him as outpatient.  Small prescription for Roxanol prescribed for pain.  I told him even if he does not swallow it it will work by just being absorbed in his mouth.  Coronary artery disease involving native coronary artery of native heart with angina pectoris with documented spasm (HCC) No complaints of chest pain.  Bilateral leg weakness Patient was seen by PT and OT while here.  Hypoglycemia From being n.p.o.  Amp of D50 given.  Patient will go home and eat at his own risk.  Normocytic anemia Last hemoglobin 10.7  Chronic deep vein thrombosis (DVT) of left femoral vein (HCC) Since patient decided to go home with hospice and has trouble swallowing I decided not to treat this chronic DVT.  Closed wedge compression fracture of third lumbar vertebra (HCC) No complaints of pain in his back at this time.  Cervical spondylosis Seen by neurosurgery.  Patient opted not to have surgical procedure for the osteophytes in his neck.  Benign essential hypertension Holding medications         Consultants: Neurosurgery, speech pathology, physical therapy, Occupational Therapy, palliative  care. Procedures performed: none Disposition: Home with hospice Diet recommendation:  Patient will eat at his own risk.  He is a high risk for aspiration.  Advised that he can put things in a blender to make it easier to swallow. DISCHARGE MEDICATION: Allergies as of 07/29/2023       Reactions   Cefazolin Diarrhea, Rash   Clindamycin Diarrhea, Rash        Medication List     STOP taking these medications     aspirin 81 MG chewable tablet   atorvastatin 40 MG tablet Commonly known as: LIPITOR   Gemtesa 75 MG Tabs Generic drug: Vibegron   lactobacillus acidophilus Tabs tablet   oxyCODONE-acetaminophen 5-325 MG tablet Commonly known as: Percocet   zinc gluconate 50 MG tablet       TAKE these medications    acetaminophen 325 MG tablet Commonly known as: TYLENOL Take 650 mg by mouth every 6 (six) hours as needed.   fluorouracil 5 % cream Commonly known as: EFUDEX Apply topically 2 (two) times daily. Apply for 7 days to scalp and forehead.   morphine CONCENTRATE 10 MG/0.5ML Soln concentrated solution Take 0.25 mLs (5 mg total) by mouth every 4 (four) hours as needed for severe pain (pain score 7-10), shortness of breath or anxiety.   timolol 0.5 % ophthalmic solution Commonly known as: TIMOPTIC Place 1 drop into both eyes daily.        Follow-up Information     Dorothey Baseman, MD Follow up in 5 day(s).   Specialty: Family Medicine Contact information: [address] [place] [address] [phone]                Discharge Exam: Filed Weights   07/28/23 0500 07/29/23 0337  Weight: 67.2 kg 65.6 kg   Physical Exam HENT:     Head: Normocephalic.     Mouth/Throat:     Pharynx: No oropharyngeal exudate.  Eyes:     General: Lids are normal.     Conjunctiva/sclera: Conjunctivae normal.  Cardiovascular:     Rate and Rhythm: Normal rate and regular rhythm.     Heart sounds: Normal heart sounds, S1 normal and S2 normal.  Pulmonary:     Breath sounds: No decreased breath sounds, wheezing, rhonchi or rales.  Abdominal:     Palpations: Abdomen is soft.     Tenderness: There is no abdominal tenderness.  Musculoskeletal:     Right lower leg: No swelling.     Left lower leg: No swelling.  Skin:    General: Skin is warm.     Findings: No rash.  Neurological:     Mental Status: He is alert.      Condition at discharge: fair  The results of  significant diagnostics from this hospitalization (including imaging, microbiology, ancillary and laboratory) are listed below for reference.   Imaging Studies: MR BRAIN WO CONTRAST  Result Date: 07/27/2023 CLINICAL DATA:  Weakness EXAM: MRI HEAD WITHOUT CONTRAST TECHNIQUE: Multiplanar, multiecho pulse sequences of the brain and surrounding structures were obtained without intravenous contrast. COMPARISON:  Head CT 07/26/2023 FINDINGS: Brain: No acute infarct, mass effect or extra-axial collection. Chronic hemorrhage in the left frontal lobe. There is multifocal hyperintense T2-weighted signal within the white matter. Parenchymal volume and CSF spaces are normal. The midline structures are normal. Vascular: Normal flow voids. Skull and upper cervical spine: Normal calvarium. Incompletely visualized upper cervical spinal hardware. Sinuses/Orbits:No paranasal sinus fluid levels or advanced mucosal thickening. No mastoid or middle  ear effusion. Normal orbits. IMPRESSION: 1. No acute intracranial abnormality. 2. Chronic hemorrhage in the left frontal lobe. 3. Findings of chronic small vessel ischemia. Electronically Signed   By: Deatra Robinson M.D.   On: 07/27/2023 19:27   DG Swallowing Func-Speech Pathology  Result Date: 07/27/2023 Table formatting from the original result was not included. Modified Barium Swallow Study Patient Details Name: TAMIKA MOHN MRN: [id number] Date of Birth: [birth year] Today's Date: 07/27/2023 HPI/PMH: HPI: Pt is a 89 year old male with past medical history of surgical repair of C3-C5 and recent Cervical Spine CT noting "Postsurgical changes are noted from C3-C5 similar to that seen on the prior exam. Osteophytic changes are noted at C6-C7." MRI of cervical spine also revealed "C6-7: Intermediate sized disc osteophyte complex" (07/26/2023). Pt reports history of esophageal damage during cervical surgery in 1984 with periodic dysphagia as a result. 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 Clinical Impression: Clinical Impression: Pt presents with the appearance of anterior ostephytes at C6-C7 (also confirmed by DG Cervical Spine 07/26/2023 and MRI of cervical spine C6-7: Intermediate sized disc osteophyte complex. When consuming thin liquids via spoon, nectar thick liquids via spoon and puree, very little of each bolus transits thru the UES d/t likely impedence of the osteophytes. Every with 6-7 swallows per bolus, pt is not able to clear the pyriform sinuses of residue with eventual sensed aspiration of all consistencies. Multiple postional modifications were trialed (head turns, head tilts and chin tuck) with no increaased ability. At this time, continue to recommend strict NPO as pt is at a very high risk of aspiration with all PO consumption. Factors that may increase risk of adverse event in presence of aspiration [name] & [name] 2021): Factors that may increase risk of adverse event in presence of aspiration [name] & [name] 2021): Limited mobility; Frail or deconditioned; Frequent aspiration of large volumes Recommendations/Plan: Swallowing Evaluation Recommendations Swallowing Evaluation Recommendations Recommendations: NPO Medication Administration: Via alternative means Oral care recommendations: Oral care QID (4x/day) Recommended consults: Consider Palliative care Treatment Plan Treatment Plan Treatment recommendations: Therapy as outlined in treatment plan below Follow-up recommendations: Follow physicians's recommendations for discharge plan and follow up therapies Functional status assessment: Patient has had a recent decline in their functional status and demonstrates the ability to make significant improvements in function in a reasonable and predictable amount of time. Treatment frequency: Min 1x/week Treatment duration: 1 week Interventions: Patient/family education Recommendations  Recommendations for follow up therapy are one component of a multi-disciplinary discharge planning process, led by the attending physician.  Recommendations may be updated based on patient status, additional functional criteria and insurance authorization. Assessment: Orofacial Exam: Orofacial Exam Oral Cavity: Oral Hygiene: WFL Oral Cavity - Dentition: Missing dentition Orofacial Anatomy: WFL Oral Motor/Sensory Function: WFL Anatomy: Anatomy: Suspected cervical osteophytes; Presence of cervical hardware Boluses Administered: Boluses Administered Boluses Administered: Thin liquids (Level 0); Mildly thick liquids (Level 2, nectar thick); Puree  Oral Impairment Domain: Oral Impairment Domain Lip Closure: No labial escape Tongue control during bolus hold: Cohesive bolus between tongue to palatal seal Bolus preparation/mastication: Timely and efficient chewing and mashing Bolus transport/lingual motion: Brisk tongue motion Oral residue: Complete oral clearance Location of oral residue : N/A Initiation of pharyngeal swallow : Posterior laryngeal surface of the epiglottis; Pyriform sinuses  Pharyngeal Impairment Domain: Pharyngeal Impairment Domain Soft palate elevation: No bolus between soft palate (SP)/pharyngeal wall (PW) Laryngeal elevation: Partial superior movement  of thyroid cartilage/partial approximation of arytenoids to epiglottic petiole; Minimal superior movement of thyroid cartilage with minimal approximation of arytenoids to epiglottic petiole Anterior hyoid excursion: Partial anterior movement; No anterior movement Epiglottic movement: Partial inversion Laryngeal vestibule closure: Incomplete, narrow column air/contrast in laryngeal vestibule; None, wide column air/contrast in laryngeal vestibule Pharyngeal stripping wave : Present - diminished Pharyngoesophageal segment opening: Minimal distention/minimal duration, marked obstruction of flow; No distension with total obstruction of flow Tongue base  retraction: No contrast between tongue base and posterior pharyngeal wall (PPW) Pharyngeal residue: Majority of contrast within or on pharyngeal structures; Minimal to no pharyngeal clearance Location of pharyngeal residue: Pyriform sinuses  Esophageal Impairment Domain: No data recorded Pill: No data recorded Penetration/Aspiration Scale Score: Penetration/Aspiration Scale Score 7.  Material enters airway, passes BELOW cords and not ejected out despite cough attempt by patient: Thin liquids (Level 0); Mildly thick liquids (Level 2, nectar thick); Puree Compensatory Strategies: Compensatory Strategies Compensatory strategies: Yes Multiple swallows: Ineffective Ineffective Multiple Swallows: Thin liquid (Level 0); Mildly thick liquid (Level 2, nectar thick); Puree Chin tuck: Ineffective Ineffective Chin Tuck: Mildly thick liquid (Level 2, nectar thick) Left head turn: Ineffective Ineffective Left Head Turn: Mildly thick liquid (Level 2, nectar thick) Right head turn: Ineffective Ineffective Right Head Turn: Mildly thick liquid (Level 2, nectar thick)   General Information: Caregiver present: No  Diet Prior to this Study: NPO   Temperature : Normal   Respiratory Status: WFL   Supplemental O2: None (Room air)   History of Recent Intubation: No  Behavior/Cognition: Alert; Cooperative; Pleasant mood Self-Feeding Abilities: Able to self-feed Baseline vocal quality/speech: Normal Volitional Cough: Able to elicit Volitional Swallow: Able to elicit Exam Limitations: No limitations Goal Planning: Prognosis for improved oropharyngeal function: Guarded Barriers to Reach Goals: -- (large anterior cervical osteophytes) No data recorded Patient/Family Stated Goal: pt's dysphagia has worsened over last several days - wish to find out reason why Consulted and agree with results and recommendations: Patient Pain: Pain Assessment Pain Assessment: No/denies pain End of Session: Start Time:SLP Start Time (ACUTE ONLY): 1150 Stop Time:  SLP Stop Time (ACUTE ONLY): 1210 Time Calculation:SLP Time Calculation (min) (ACUTE ONLY): 20 min Charges: SLP Evaluations $ SLP Speech Visit: 1 Visit SLP Evaluations $BSS Swallow: 1 Procedure $MBS Swallow: 1 Procedure SLP visit diagnosis: SLP Visit Diagnosis: Dysphagia, pharyngeal phase (R13.13) Past Medical History: Past Medical History: Diagnosis Date  Actinic keratosis   Basal cell carcinoma 02/24/2021  R nose, EDC 03/24/21  Clotting disorder (HCC)   Coronary artery disease   a. 02/2012 PCI ([facility]): OM1 99% (2.5x18 Resolute DES).  ED (erectile dysfunction)   Glaucoma   Hyperlipidemia   Hypertension   Hypotestosteronism   MI (myocardial infarction) (HCC)   Osteoarthritis   Raynaud's disease   Skin cancer years ago  forehead - treated by Dr Jarold Motto  Squamous cell carcinoma of skin 07/13/2022  L lat upper arm, scheduled for [facility]  SVT (supraventricular tachycardia) (HCC)   Syncope and collapse  Past Surgical History: Past Surgical History: Procedure Laterality Date  CARDIAC CATHETERIZATION    CERVICAL SPINE SURGERY    CORONARY ANGIOPLASTY WITH STENT PLACEMENT  02/29/2012  [facility] placed the stent 2.5 mm x 18 mm Ref # [id number] LOT # [id number]  lung cyst removed     TONSILLECTOMY    TRACHEAL SURGERY   Happi Overton 07/27/2023, 12:57 PM  US Venous Img Lower Bilateral (DVT)  Result Date: 07/27/2023 CLINICAL DATA:  [id number] Edema leg [id number] Bilateral leg weakness  [id number] EXAM: BILATERAL LOWER EXTREMITY VENOUS DOPPLER ULTRASOUND TECHNIQUE: Gray-scale sonography with graded compression, as well as color Doppler and duplex ultrasound were performed to evaluate the lower extremity deep venous systems from the level of the common femoral vein and including the common femoral, femoral, profunda femoral, popliteal and calf veins including the posterior tibial, peroneal and gastrocnemius veins when visible. The superficial great saphenous vein was also interrogated. Spectral Doppler was utilized to evaluate flow at rest  and with distal augmentation maneuvers in the common femoral, femoral and popliteal veins. COMPARISON:  None Available. FINDINGS: RIGHT LOWER EXTREMITY Common Femoral Vein: No evidence of thrombus. Normal compressibility, respiratory phasicity and response to augmentation. Saphenofemoral Junction: No evidence of thrombus. Normal compressibility and flow on color Doppler imaging. Profunda Femoral Vein: No evidence of thrombus. Normal compressibility and flow on color Doppler imaging. Femoral Vein: No evidence of thrombus. Normal compressibility, respiratory phasicity and response to augmentation. Popliteal Vein: No evidence of thrombus. Normal compressibility, respiratory phasicity and response to augmentation. Calf Veins: No evidence of thrombus. Normal compressibility and flow on color Doppler imaging. LEFT LOWER EXTREMITY Common Femoral Vein: No evidence of thrombus. Normal compressibility, respiratory phasicity and response to augmentation. Saphenofemoral Junction: No evidence of thrombus. Normal compressibility and flow on color Doppler imaging. Profunda Femoral Vein: Occluded with echogenic thrombus. Femoral Vein: No evidence of thrombus. Normal compressibility, respiratory phasicity and response to augmentation. Popliteal Vein: No evidence of thrombus. Normal compressibility, respiratory phasicity and response to augmentation. Calf Veins: No evidence of thrombus. Normal compressibility and flow on color Doppler imaging. Other Findings:  None. IMPRESSION: 1. The left profunda femoral vein is occluded with echogenic thrombus, favored more chronic DVT. The remainder of the left lower extremity deep veins are patent. 2. No findings of DVT in the right lower extremity. Electronically Signed   By: Olive Bass M.D.   On: 07/27/2023 08:42   MR CERVICAL SPINE W WO CONTRAST  Result Date: 07/27/2023 CLINICAL DATA:  Back pain with bilateral lower extremity weakness. EXAM: MRI CERVICAL, THORACIC AND LUMBAR SPINE  WITHOUT AND WITH CONTRAST TECHNIQUE: Multiplanar and multiecho pulse sequences of the cervical spine, to include the craniocervical junction and cervicothoracic junction, and thoracic and lumbar spine, were obtained without and with intravenous contrast. CONTRAST:  7mL GADAVIST GADOBUTROL 1 MMOL/ML IV SOLN COMPARISON:  Cervical spine MRI 03/08/2013 Cervical spine CT 04/27/2023 Lumbar spine MRI 07/19/2023 FINDINGS: MRI CERVICAL SPINE FINDINGS Alignment: Physiologic. Vertebrae: Anterior fusion hardware spans the C3-5 levels. There is solid anterior arthrodesis. There is also osseous fusion across the C5-6 disc space. No acute fracture or other focal osseous lesion. Cord: Small caliber spinal cord noted at the C3-5 levels. No spinal cord signal abnormality. Posterior Fossa, vertebral arteries, paraspinal tissues: Vertebral artery flow voids are normal. Normal posterior fossa. Disc levels: C1-2: Unremarkable. C2-3: Small disc bulge with bilateral uncovertebral hypertrophy. There is no spinal canal stenosis. Moderate right and severe left neural foraminal stenosis. C3-4: Anterior fusion. There is no spinal canal stenosis. No neural foraminal stenosis. C4-5: Anterior fusion. There is no spinal canal stenosis. Right asymmetric osseous spurring causes moderate right foraminal stenosis. No left neural foraminal stenosis. C5-6: Limited visualization of the spinal canal due to susceptibility effects from spinal hardware. No visible central spinal canal stenosis. Limited assessment for neural foraminal stenosis. C6-7: Intermediate sized disc osteophyte complex. Moderate spinal canal stenosis. Severe bilateral neural foraminal stenosis. C7-T1: Normal disc space and facet joints. There is no spinal canal stenosis. No neural foraminal stenosis. MRI THORACIC  SPINE FINDINGS Alignment:  Physiologic. Vertebrae: No fracture, evidence of discitis, or bone lesion. Mild degenerative height loss at T4. No abnormal contrast enhancement.  Cord:  Normal signal and morphology.  No epidural abnormality. Paraspinal and other soft tissues: Negative. Disc levels: There is mild multilevel degenerative disc disease but no spinal canal stenosis or neural impingement. MRI LUMBAR SPINE FINDINGS Segmentation: Transitional anatomy with lowest disc space labeled as S1-S2, unchanged. Alignment:  Physiologic. Vertebrae: Unchanged appearance of compression fractures at L1 and L3 compared to MRI from 07/19/2023. No new compression fracture. Conus medullaris and cauda equina: Conus extends to the L2 level. Conus and cauda equina appear normal. No abnormal epidural contrast enhancement. Paraspinal and other soft tissues: Negative. Disc levels: L1-L2: Small disc bulge. No spinal canal stenosis. No neural foraminal stenosis. L2-L3: Small disc bulge. No spinal canal stenosis. No neural foraminal stenosis. L3-L4: Minimal disc bulge and mild facet hypertrophy. No spinal canal stenosis. No neural foraminal stenosis. L4-L5: Small left asymmetric disc bulge. No spinal canal stenosis. Mild left neural foraminal stenosis. L5-S1: Intermediate sized disc bulge with moderate facet arthrosis. Narrowing of both lateral recesses without central spinal canal stenosis. No neural foraminal stenosis. S1-S2: Normal disc space. No spinal canal or neural foraminal stenosis. Visualized sacrum: Normal. IMPRESSION: 1. No acute abnormality of the cervical, thoracic or lumbar spine. No discitis-osteomyelitis or epidural abscess. 2. Atrophic appearance of the cervical spinal cord at the C3-5 levels. 3. Moderate C6-7 spinal canal stenosis and severe bilateral neural foraminal stenosis. 4. Moderate right and severe left C2-3 neural foraminal stenosis. 5. Unchanged appearance of compression fractures at L1 and L3 compared to MRI from 07/19/2023. No new compression fracture. 6. Narrowing of both lateral recesses at L5-S1 without central spinal canal stenosis. Electronically Signed   By: Deatra Robinson  M.D.   On: 07/27/2023 02:19   MR THORACIC SPINE W WO CONTRAST  Result Date: 07/27/2023 CLINICAL DATA:  Back pain with bilateral lower extremity weakness. EXAM: MRI CERVICAL, THORACIC AND LUMBAR SPINE WITHOUT AND WITH CONTRAST TECHNIQUE: Multiplanar and multiecho pulse sequences of the cervical spine, to include the craniocervical junction and cervicothoracic junction, and thoracic and lumbar spine, were obtained without and with intravenous contrast. CONTRAST:  7mL GADAVIST GADOBUTROL 1 MMOL/ML IV SOLN COMPARISON:  Cervical spine MRI 03/08/2013 Cervical spine CT 04/27/2023 Lumbar spine MRI 07/19/2023 FINDINGS: MRI CERVICAL SPINE FINDINGS Alignment: Physiologic. Vertebrae: Anterior fusion hardware spans the C3-5 levels. There is solid anterior arthrodesis. There is also osseous fusion across the C5-6 disc space. No acute fracture or other focal osseous lesion. Cord: Small caliber spinal cord noted at the C3-5 levels. No spinal cord signal abnormality. Posterior Fossa, vertebral arteries, paraspinal tissues: Vertebral artery flow voids are normal. Normal posterior fossa. Disc levels: C1-2: Unremarkable. C2-3: Small disc bulge with bilateral uncovertebral hypertrophy. There is no spinal canal stenosis. Moderate right and severe left neural foraminal stenosis. C3-4: Anterior fusion. There is no spinal canal stenosis. No neural foraminal stenosis. C4-5: Anterior fusion. There is no spinal canal stenosis. Right asymmetric osseous spurring causes moderate right foraminal stenosis. No left neural foraminal stenosis. C5-6: Limited visualization of the spinal canal due to susceptibility effects from spinal hardware. No visible central spinal canal stenosis. Limited assessment for neural foraminal stenosis. C6-7: Intermediate sized disc osteophyte complex. Moderate spinal canal stenosis. Severe bilateral neural foraminal stenosis. C7-T1: Normal disc space and facet joints. There is no spinal canal stenosis. No neural  foraminal stenosis. MRI THORACIC SPINE FINDINGS Alignment:  Physiologic. Vertebrae: No fracture,  evidence of discitis, or bone lesion. Mild degenerative height loss at T4. No abnormal contrast enhancement. Cord:  Normal signal and morphology.  No epidural abnormality. Paraspinal and other soft tissues: Negative. Disc levels: There is mild multilevel degenerative disc disease but no spinal canal stenosis or neural impingement. MRI LUMBAR SPINE FINDINGS Segmentation: Transitional anatomy with lowest disc space labeled as S1-S2, unchanged. Alignment:  Physiologic. Vertebrae: Unchanged appearance of compression fractures at L1 and L3 compared to MRI from 07/19/2023. No new compression fracture. Conus medullaris and cauda equina: Conus extends to the L2 level. Conus and cauda equina appear normal. No abnormal epidural contrast enhancement. Paraspinal and other soft tissues: Negative. Disc levels: L1-L2: Small disc bulge. No spinal canal stenosis. No neural foraminal stenosis. L2-L3: Small disc bulge. No spinal canal stenosis. No neural foraminal stenosis. L3-L4: Minimal disc bulge and mild facet hypertrophy. No spinal canal stenosis. No neural foraminal stenosis. L4-L5: Small left asymmetric disc bulge. No spinal canal stenosis. Mild left neural foraminal stenosis. L5-S1: Intermediate sized disc bulge with moderate facet arthrosis. Narrowing of both lateral recesses without central spinal canal stenosis. No neural foraminal stenosis. S1-S2: Normal disc space. No spinal canal or neural foraminal stenosis. Visualized sacrum: Normal. IMPRESSION: 1. No acute abnormality of the cervical, thoracic or lumbar spine. No discitis-osteomyelitis or epidural abscess. 2. Atrophic appearance of the cervical spinal cord at the C3-5 levels. 3. Moderate C6-7 spinal canal stenosis and severe bilateral neural foraminal stenosis. 4. Moderate right and severe left C2-3 neural foraminal stenosis. 5. Unchanged appearance of compression  fractures at L1 and L3 compared to MRI from 07/19/2023. No new compression fracture. 6. Narrowing of both lateral recesses at L5-S1 without central spinal canal stenosis. Electronically Signed   By: Deatra Robinson M.D.   On: 07/27/2023 02:19   MR Lumbar Spine W Wo Contrast  Result Date: 07/27/2023 CLINICAL DATA:  Back pain with bilateral lower extremity weakness. EXAM: MRI CERVICAL, THORACIC AND LUMBAR SPINE WITHOUT AND WITH CONTRAST TECHNIQUE: Multiplanar and multiecho pulse sequences of the cervical spine, to include the craniocervical junction and cervicothoracic junction, and thoracic and lumbar spine, were obtained without and with intravenous contrast. CONTRAST:  7mL GADAVIST GADOBUTROL 1 MMOL/ML IV SOLN COMPARISON:  Cervical spine MRI 03/08/2013 Cervical spine CT 04/27/2023 Lumbar spine MRI 07/19/2023 FINDINGS: MRI CERVICAL SPINE FINDINGS Alignment: Physiologic. Vertebrae: Anterior fusion hardware spans the C3-5 levels. There is solid anterior arthrodesis. There is also osseous fusion across the C5-6 disc space. No acute fracture or other focal osseous lesion. Cord: Small caliber spinal cord noted at the C3-5 levels. No spinal cord signal abnormality. Posterior Fossa, vertebral arteries, paraspinal tissues: Vertebral artery flow voids are normal. Normal posterior fossa. Disc levels: C1-2: Unremarkable. C2-3: Small disc bulge with bilateral uncovertebral hypertrophy. There is no spinal canal stenosis. Moderate right and severe left neural foraminal stenosis. C3-4: Anterior fusion. There is no spinal canal stenosis. No neural foraminal stenosis. C4-5: Anterior fusion. There is no spinal canal stenosis. Right asymmetric osseous spurring causes moderate right foraminal stenosis. No left neural foraminal stenosis. C5-6: Limited visualization of the spinal canal due to susceptibility effects from spinal hardware. No visible central spinal canal stenosis. Limited assessment for neural foraminal stenosis. C6-7:  Intermediate sized disc osteophyte complex. Moderate spinal canal stenosis. Severe bilateral neural foraminal stenosis. C7-T1: Normal disc space and facet joints. There is no spinal canal stenosis. No neural foraminal stenosis. MRI THORACIC SPINE FINDINGS Alignment:  Physiologic. Vertebrae: No fracture, evidence of discitis, or bone lesion. Mild degenerative height  loss at T4. No abnormal contrast enhancement. Cord:  Normal signal and morphology.  No epidural abnormality. Paraspinal and other soft tissues: Negative. Disc levels: There is mild multilevel degenerative disc disease but no spinal canal stenosis or neural impingement. MRI LUMBAR SPINE FINDINGS Segmentation: Transitional anatomy with lowest disc space labeled as S1-S2, unchanged. Alignment:  Physiologic. Vertebrae: Unchanged appearance of compression fractures at L1 and L3 compared to MRI from 07/19/2023. No new compression fracture. Conus medullaris and cauda equina: Conus extends to the L2 level. Conus and cauda equina appear normal. No abnormal epidural contrast enhancement. Paraspinal and other soft tissues: Negative. Disc levels: L1-L2: Small disc bulge. No spinal canal stenosis. No neural foraminal stenosis. L2-L3: Small disc bulge. No spinal canal stenosis. No neural foraminal stenosis. L3-L4: Minimal disc bulge and mild facet hypertrophy. No spinal canal stenosis. No neural foraminal stenosis. L4-L5: Small left asymmetric disc bulge. No spinal canal stenosis. Mild left neural foraminal stenosis. L5-S1: Intermediate sized disc bulge with moderate facet arthrosis. Narrowing of both lateral recesses without central spinal canal stenosis. No neural foraminal stenosis. S1-S2: Normal disc space. No spinal canal or neural foraminal stenosis. Visualized sacrum: Normal. IMPRESSION: 1. No acute abnormality of the cervical, thoracic or lumbar spine. No discitis-osteomyelitis or epidural abscess. 2. Atrophic appearance of the cervical spinal cord at the C3-5  levels. 3. Moderate C6-7 spinal canal stenosis and severe bilateral neural foraminal stenosis. 4. Moderate right and severe left C2-3 neural foraminal stenosis. 5. Unchanged appearance of compression fractures at L1 and L3 compared to MRI from 07/19/2023. No new compression fracture. 6. Narrowing of both lateral recesses at L5-S1 without central spinal canal stenosis. Electronically Signed   By: Deatra Robinson M.D.   On: 07/27/2023 02:19   DG Lumbar Spine Complete  Result Date: 07/27/2023 CLINICAL DATA:  L3 compression fracture EXAM: LUMBAR SPINE - COMPLETE 4+ VIEW COMPARISON:  07/19/2023 MRI FINDINGS: Stable compression deformity of L1 and L3 are seen. Facet hypertrophic changes are noted. Flexion and extension views show no significant instability. Aortic calcifications are seen. No soft tissue abnormality is noted. IMPRESSION: L1 and L3 compression deformities stable from the prior exam. No instability on flexion and extension is noted. Electronically Signed   By: Alcide Clever M.D.   On: 07/27/2023 01:07   DG Pelvis 1-2 Views  Result Date: 07/27/2023 CLINICAL DATA:  Bilateral hip pain, initial encounter EXAM: PELVIS - 1-2 VIEW COMPARISON:  07/27/2018 FINDINGS: Pelvic ring is intact. Mild degenerative changes of the hip joints are seen. Dystrophic bony formation along the lateral left iliac wing is noted. This is stable from the prior exam. No soft tissue abnormality is seen. IMPRESSION: Chronic changes without acute abnormality. Electronically Signed   By: Alcide Clever M.D.   On: 07/27/2023 01:05   CT HEAD WO CONTRAST ( )  Result Date: 07/27/2023 CLINICAL DATA:  Dysphagia, unexplained LE weakness EXAM: CT HEAD WITHOUT CONTRAST TECHNIQUE: Contiguous axial images were obtained from the base of the skull through the vertex without intravenous contrast. RADIATION DOSE REDUCTION: This exam was performed according to the departmental dose-optimization program which includes automated exposure control,  adjustment of the mA and/or kV according to patient size and/or use of iterative reconstruction technique. COMPARISON:  CT head 04/27/2023 FINDINGS: Brain: Cerebral ventricle sizes are concordant with the degree of cerebral volume loss. Patchy and confluent areas of decreased attenuation are noted throughout the deep and periventricular white matter of the cerebral hemispheres bilaterally, compatible with chronic microvascular ischemic disease. no evidence of large-territorial  acute infarction. No parenchymal hemorrhage. No mass lesion. No extra-axial collection. No mass effect or midline shift. No hydrocephalus. Basilar cisterns are patent. Vascular: No hyperdense vessel. Atherosclerotic calcifications are present within the cavernous internal carotid arteries. Skull: No acute fracture or focal lesion. Sinuses/Orbits: Paranasal sinuses and mastoid air cells are clear. The orbits are unremarkable. Other: None. IMPRESSION: No acute intracranial abnormality. Electronically Signed   By: Tish Frederickson M.D.   On: 07/27/2023 00:14   DG Cervical Spine 2-3 Views  Result Date: 07/26/2023 CLINICAL DATA:  Neck pain EXAM: CERVICAL SPINE - 3 VIEW COMPARISON:  06/11/2013 FINDINGS: Seven cervical segments are well visualized. Postsurgical changes are noted from C3-C5 similar to that seen on the prior exam. Osteophytic changes are noted at C6-C7. No prevertebral soft tissue abnormality is noted. The odontoid is within normal limits. Carotid calcifications are seen. IMPRESSION: Postsurgical changes stable from the prior exam. No acute abnormality noted. Electronically Signed   By: Alcide Clever M.D.   On: 07/26/2023 21:43   DG Thoracic Spine 2 View  Result Date: 07/26/2023 CLINICAL DATA:  pain EXAM: THORACIC SPINE 2 VIEWS COMPARISON:  CT abdomen pelvis 07/27/2018 trauma CT chest 01/01/2013 FINDINGS: Limited evaluation due to overlapping osseous structures and overlying soft tissues. There is no evidence of thoracic spine  fracture. Alignment is normal. No other significant bone abnormalities are identified. Punctate calcifications overlying the right lower lung zone and to a lesser extent left lower lung zone. Atherosclerotic plaque. IMPRESSION: 1. No acute displaced fracture or traumatic listhesis of the thoracic spine. Limited evaluation due to overlapping osseous structures and overlying soft tissues. 2.  Aortic Atherosclerosis (ICD10-I70.0). Electronically Signed   By: Tish Frederickson M.D.   On: 07/26/2023 21:42   MR LUMBAR SPINE WO CONTRAST  Result Date: 07/19/2023 CLINICAL DATA:  Back pain and leg weakness EXAM: MRI LUMBAR SPINE WITHOUT CONTRAST TECHNIQUE: Multiplanar, multisequence MR imaging of the lumbar spine was performed. No intravenous contrast was administered. COMPARISON:  No prior MRI available, correlation is made with 06/28/2023 lumbar spine radiographs and 04/27/2023 CT lumbar spine FINDINGS: Segmentation: Lumbarization of S1. The last fully formed disc space is labeled S1-S2, in keeping with the prior CT. Alignment:  No significant listhesis. Vertebrae: Redemonstrated chronic compression fractures of L1 and L3, with approximately 60% height loss L1 a in 70% height loss of L3. 3 mm retropulsion of posterosuperior cortex of L1 and 5 mm retropulsion of the posterosuperior cortex of L3. No evidence of acute fracture, discitis, or suspicious osseous lesion. Conus medullaris and cauda equina: Conus extends to the L2 level. Conus and cauda equina appear normal. Paraspinal and other soft tissues: No acute finding. Disc levels: T12-L1: Not fully included in the field of view. L1-L2: Seen only on the sagittal images. Minimal disc bulge. No spinal canal stenosis or neural foraminal narrowing. L2-L3: Minimal disc bulge. Mild facet arthropathy. Narrowing of the lateral recesses. Retropulsion of the central posterosuperior cortex of L3. Mild-to-moderate spinal canal stenosis at the level of the retropulsion. No neural  foraminal narrowing. L3-L4: Minimal disc bulge. Moderate facet arthropathy. No spinal canal stenosis or neural foraminal narrowing. L4-L5: Mild disc bulge. Moderate facet arthropathy. Narrowing of the lateral recesses. No spinal canal stenosis. Mild bilateral neural foraminal narrowing. L5-S1: Mild disc bulge. Moderate facet arthropathy. Ligamentum flavum hypertrophy. Narrowing of the lateral recesses. No spinal canal stenosis. No neural foraminal narrowing. S1-S2: No significant disc bulge. No spinal canal stenosis or foraminal narrowing. IMPRESSION: 1. Chronic compression fractures of L1 and L3.  No acute fracture. 2. Mild-to-moderate spinal canal stenosis at the posterosuperior aspect of L3. 3. L4-L5 mild bilateral neural foraminal narrowing. 4. Narrowing of the lateral recesses at L2-L3, L4-L5, and L5-S1, which could affect the descending L3, L5, and S1 nerve roots, respectively. 5. Multilevel facet arthropathy, which can be a cause of back pain. 6. Transitional anatomy, with lumbarization of S1. Please correlate with imaging if any intervention is planned. Electronically Signed   By: Wiliam Ke M.D.   On: 07/19/2023 22:32    Microbiology: Results for orders placed or performed during the hospital encounter of 07/26/23  Blood culture (routine x 2)     Status: None (Preliminary result)   Collection Time: 07/26/23  7:07 PM   Specimen: BLOOD  Result Value Ref Range Status   Specimen Description BLOOD BLOOD LEFT ARM  Final   Special Requests   Final    BOTTLES DRAWN AEROBIC AND ANAEROBIC Blood Culture adequate volume   Culture   Final    NO GROWTH 3 DAYS Performed at [facility], [address] [place], [place] [address]    Report Status PENDING  Incomplete  Blood culture (routine x 2)     Status: None (Preliminary result)   Collection Time: 07/26/23  7:08 PM   Specimen: BLOOD  Result Value Ref Range Status   Specimen Description BLOOD BLOOD RIGHT ARM  Final   Special Requests    Final    BOTTLES DRAWN AEROBIC AND ANAEROBIC Blood Culture adequate volume   Culture   Final    NO GROWTH 3 DAYS Performed at [facility], [address] [place], [place] [address]    Report Status PENDING  Incomplete    Labs: CBC: Recent Labs  Lab 07/26/23 1552 07/27/23 0450 07/29/23 0624  WBC 8.9 6.0 8.6  NEUTROABS 6.3  --   --   HGB 12.1* 11.0* 10.7*  HCT 38.4* 34.7* 33.6*  MCV 89.3 89.4 88.7  PLT 373 302 361   Basic Metabolic Panel: Recent Labs  Lab 07/26/23 1552 07/27/23 0450 07/29/23 0624  NA 137 137 138  K 4.6 4.8 4.0  CL 101 103 104  CO2 26 26 20*  GLUCOSE 108* 123* 51*  BUN 28* 31* 22  CREATININE 0.92 0.90 0.86  CALCIUM 9.3 8.5* 8.6*  MG  --  2.3  --    Liver Function Tests: Recent Labs  Lab 07/26/23 1552 07/27/23 0450  AST 41 28  ALT 44 34  ALKPHOS 114 102  BILITOT 0.7 0.7  PROT 7.3 6.2*  ALBUMIN 3.5 2.9*   CBG: No results for input(s): "GLUCAP" in the last 168 hours.  Discharge time spent: greater than 30 minutes.  Signed: Alford Highland, MD [facility] 07/29/2023

## 2023-07-29 NOTE — Assessment & Plan Note (Addendum)
From being n.p.o.  Amp of D50 given.  Patient will go home and eat at his own risk.

## 2023-07-29 NOTE — TOC Transition Note (Signed)
Transition of Care [facility]) - CM/SW Discharge Note   Patient Details  Name: Patrick Malone MRN: [id number] Date of Birth: June 15, 1932  [facility] CM/SW Contact:  [name], RN Phone Number: 07/29/2023, 12:13 PM   Clinical Narrative:     Spoke with MD and daughter, patient failed swallow study, diagnosed with failure to thrive.  Patient and family wanting to go home with hospice today.  Daughter denies needing any equipment, prefers to have [facility] for hospice services.  Gwendolyn Grant made aware and will contact daughter directly to establish home services.  TOC will sign off.   Final next level of care: Home w Hospice Care Barriers to Discharge: Barriers Resolved   Patient Goals and CMS Choice      Discharge Placement                         Discharge Plan and Services Additional resources added to the After Visit Summary for                                       Social Determinants of Health (SDOH) Interventions SDOH Screenings   Food Insecurity: No Food Insecurity (07/27/2023)  Housing: Low Risk  (07/27/2023)  Transportation Needs: No Transportation Needs (07/27/2023)  Utilities: Not At Risk (07/27/2023)  Financial Resource Strain: Low Risk  (11/14/2022)   Received from [facility], [facility]  Tobacco Use: Medium Risk (07/27/2023)     Readmission Risk Interventions     No data to display

## 2023-07-29 NOTE — Discharge Instructions (Signed)
Can eat knowing that you are a high risk for aspiration.  Can drink ensure for extra calories

## 2023-07-29 NOTE — Progress Notes (Signed)
[facility] LIAISON NOTE   Received request from [name], RN Transitions of Care Manager, for hospice services at home after discharge. Spoke with Patrick Malone to initiate education related to hospice philosophy, services, and team approach to care. Patient/family verbalized understanding of information given. Per discussion, the plan is for discharge home private vehicle today.  DME needs discussed. No immediate DME needs.  Hospice admission nurse will assess for any equipment needs on initial visit.  Patient has the following equipment in the home:                 Please provide prescriptions at discharge as needed to ensure ongoing symptom management.  [facility] information and contact numbers given to [facility]. Above information shared with [name], RN, Transitions of Care Manager and hospital medical team.   Please call with any hospice related questions or concerns. Thank you for the opportunity to participate in this patient's care.   [name], RN Nurse Liaison [phone]

## 2023-07-29 NOTE — Progress Notes (Signed)
Reviewed discharge instructions with patient and daughter. Patient and daughter acknowledged understanding. Patient changed into paper scrubs. Patient discharged with personal belongings. Patient wheeled out by staff, not in distress. Patient transported via family vehicle.

## 2023-07-29 NOTE — Plan of Care (Signed)
  Problem: Health Behavior/Discharge Planning: Goal: Ability to manage health-related needs will improve Outcome: Progressing   Problem: Clinical Measurements: Goal: Ability to maintain clinical measurements within normal limits will improve Outcome: Progressing Goal: Cardiovascular complication will be avoided Outcome: Progressing   Problem: Activity: Goal: Risk for activity intolerance will decrease Outcome: Progressing   Problem: Elimination: Goal: Will not experience complications related to bowel motility Outcome: Progressing Goal: Will not experience complications related to urinary retention Outcome: Progressing   Problem: Pain Management: Goal: General experience of comfort will improve Outcome: Progressing

## 2023-07-29 NOTE — Assessment & Plan Note (Addendum)
Patient will go home with hospice.  He wanted to remain a full code at this point but family will decide if he is unable to make a decision.  Hospice can follow-up with him as outpatient.  Small prescription for Roxanol prescribed for pain.  I told him even if he does not swallow it it will work by just being absorbed in his mouth.

## 2023-07-29 NOTE — Plan of Care (Signed)
  Problem: Education: Goal: Knowledge of General Education information will improve Description: Including pain rating scale, medication(s)/side effects and non-pharmacologic comfort measures Outcome: Progressing   Problem: Activity: Goal: Risk for activity intolerance will decrease Outcome: Progressing   Problem: Pain Management: Goal: General experience of comfort will improve Outcome: Progressing   Problem: Safety: Goal: Ability to remain free from injury will improve Outcome: Progressing

## 2023-07-31 LAB — CULTURE, BLOOD (ROUTINE X 2)
Culture: NO GROWTH
Culture: NO GROWTH
Special Requests: ADEQUATE
Special Requests: ADEQUATE

## 2023-08-04 ENCOUNTER — Telehealth: Payer: Self-pay | Admitting: Orthopedic Surgery

## 2023-08-04 ENCOUNTER — Encounter: Payer: Self-pay | Admitting: Orthopedic Surgery

## 2023-08-04 NOTE — Telephone Encounter (Signed)
Called his daughter [name] to check on him. Left her a VM to return my call.

## 2023-08-04 NOTE — Progress Notes (Signed)
Called and spoke with patient and his daughter [name]. He is feeling much better. No back or leg pain. Hospice is coming out to house.   Will call him in 2 weeks to check on him.

## 2024-01-16 ENCOUNTER — Ambulatory Visit: Payer: Self-pay | Admitting: Physician Assistant

## 2024-02-05 ENCOUNTER — Encounter: Payer: Self-pay | Admitting: Orthopedic Surgery

## 2024-02-05 NOTE — Progress Notes (Signed)
 He is having some increased back and leg pain. Recommend he make f/u to see me for evaluation. He is on hospice and home. He wants to discuss with hospice nurses and will call back to schedule a visit with me.

## 2024-06-03 DEATH — deceased
